# Patient Record
Sex: Female | Born: 2000 | Race: Black or African American | Hispanic: No | Marital: Single | Smoking: Former smoker
Health system: Southern US, Community
[De-identification: ages and names within clinical notes are randomized; demographics above are authoritative.]

## PROBLEM LIST (undated history)

## (undated) ENCOUNTER — Inpatient Hospital Stay (HOSPITAL_COMMUNITY): Payer: Self-pay

## (undated) DIAGNOSIS — O139 Gestational [pregnancy-induced] hypertension without significant proteinuria, unspecified trimester: Secondary | ICD-10-CM

## (undated) DIAGNOSIS — J302 Other seasonal allergic rhinitis: Secondary | ICD-10-CM

## (undated) DIAGNOSIS — J353 Hypertrophy of tonsils with hypertrophy of adenoids: Secondary | ICD-10-CM

## (undated) DIAGNOSIS — F418 Other specified anxiety disorders: Secondary | ICD-10-CM

## (undated) HISTORY — DX: Other specified anxiety disorders: F41.8

## (undated) HISTORY — DX: Gestational (pregnancy-induced) hypertension without significant proteinuria, unspecified trimester: O13.9

## (undated) HISTORY — PX: TONSILLECTOMY: SUR1361

---
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
## 2022-02-10 ENCOUNTER — Ambulatory Visit: Payer: Self-pay

## 2022-11-06 ENCOUNTER — Inpatient Hospital Stay: Admission: RE | Admit: 2022-11-06 | Payer: Self-pay | Source: Ambulatory Visit

## 2022-11-20 ENCOUNTER — Other Ambulatory Visit (HOSPITAL_COMMUNITY)
Admission: RE | Admit: 2022-11-20 | Discharge: 2022-11-20 | Disposition: A | Discharge status: Home / Self Care | Payer: Self-pay | Source: Ambulatory Visit | Attending: Obstetrics & Gynecology | Admitting: Obstetrics & Gynecology

## 2022-11-20 ENCOUNTER — Other Ambulatory Visit (INDEPENDENT_AMBULATORY_CARE_PROVIDER_SITE_OTHER): Payer: Self-pay

## 2022-11-20 DIAGNOSIS — Z113 Encounter for screening for infections with a predominantly sexual mode of transmission: Secondary | ICD-10-CM

## 2022-11-20 NOTE — Progress Notes (Signed)
   NURSE VISIT- STD  SUBJECTIVE:  Krystal [name] is a 22 y.o. G0P0000 GYN patientfemale here for a vaginal swab for STD screen.  She reports the following symptoms: none Denies abnormal vaginal bleeding, significant pelvic pain, fever, or UTI symptoms.  OBJECTIVE:  There were no vitals taken for this visit.  Appears well, in no apparent distress  ASSESSMENT: Vaginal swab for STD screen  PLAN: Self-collected vaginal probe for Gonorrhea, Chlamydia, Trichomonas, Bacterial Vaginosis, Yeast sent to lab Treatment: to be determined once results are received Follow-up as needed if symptoms persist/worsen, or new symptoms develop  Jobe Marker  11/20/2022 4:17 PM

## 2022-11-23 LAB — CERVICOVAGINAL ANCILLARY ONLY
Bacterial Vaginitis (gardnerella): POSITIVE — AB
Candida Glabrata: NEGATIVE
Candida Vaginitis: NEGATIVE
Chlamydia: NEGATIVE
Comment: NEGATIVE
Comment: NEGATIVE
Comment: NEGATIVE
Comment: NEGATIVE
Comment: NEGATIVE
Comment: NORMAL
Neisseria Gonorrhea: NEGATIVE
Trichomonas: NEGATIVE

## 2022-11-26 ENCOUNTER — Other Ambulatory Visit: Payer: Self-pay | Admitting: Adult Health

## 2022-11-26 MED ORDER — METRONIDAZOLE 500 MG PO TABS: 500.0000 mg | ORAL_TABLET | Freq: Two times a day (BID) | ORAL | 0 refills | Status: DC

## 2022-11-26 NOTE — Progress Notes (Signed)
+  BV on vaginal swab will rx flagyl,no sex or alcohol while taking  ?

## 2023-01-10 ENCOUNTER — Ambulatory Visit: Payer: Self-pay | Admitting: Adult Health

## 2023-04-07 ENCOUNTER — Ambulatory Visit: Payer: Self-pay

## 2023-04-12 ENCOUNTER — Ambulatory Visit: Payer: Self-pay

## 2023-05-06 ENCOUNTER — Ambulatory Visit (INDEPENDENT_AMBULATORY_CARE_PROVIDER_SITE_OTHER): Payer: Self-pay | Admitting: *Deleted

## 2023-05-06 ENCOUNTER — Ambulatory Visit: Payer: Self-pay

## 2023-05-06 VITALS — BP 139/90 | HR 108 | Ht 68.0 in | Wt 239.0 lb

## 2023-05-06 DIAGNOSIS — N926 Irregular menstruation, unspecified: Secondary | ICD-10-CM

## 2023-05-06 DIAGNOSIS — Z3201 Encounter for pregnancy test, result positive: Secondary | ICD-10-CM

## 2023-05-06 LAB — POCT URINE PREGNANCY: Preg Test, Ur: POSITIVE — AB

## 2023-05-06 NOTE — Progress Notes (Signed)
   NURSE VISIT- PREGNANCY CONFIRMATION   SUBJECTIVE:  Krystal [name] is a 22 y.o. G66P0000 female at [redacted]w[redacted]d by certain LMP of Patient's last menstrual period was 04/11/2023. Here for pregnancy confirmation.  Home pregnancy test: positive x 5   She reports no complaints.  She is not taking prenatal vitamins.    OBJECTIVE:  BP (!) 139/90 (BP Location: Left Arm, Patient Position: Sitting)   Pulse (!) 108   Ht 5\' 8"  (1.727 m)   Wt 239 lb (108.4 kg)   LMP 04/11/2023   BMI 36.34 kg/m   Appears well, in no apparent distress  Results for orders placed or performed in visit on 05/06/23 (from the past 24 hours)  POCT urine pregnancy   Collection Time: 05/06/23 10:53 AM  Result Value Ref Range   Preg Test, Ur Positive (A) Negative    ASSESSMENT: Positive pregnancy test, [redacted]w[redacted]d by LMP    PLAN: Schedule for dating ultrasound in 4 weeks Prenatal vitamins: plans to begin OTC ASAP   Nausea medicines: not currently needed   OB packet given: Yes  Jobe Marker  05/06/2023 10:55 AM

## 2023-05-23 ENCOUNTER — Telehealth: Payer: Self-pay | Admitting: *Deleted

## 2023-05-23 NOTE — Telephone Encounter (Signed)
 Patient called with c/o very light spotting a few days ago and then again a few days prior.  Denies recent intercourse. States she is not currently bleeding and the spotting was like 2 spots.  Advised to continue to monitor and if bleeding becomes heavy like a period and/or passing clots, to let us  know.  Encouraged to push fluids and pelvic rest. Pt verbalized understanding with no further questions.

## 2023-05-28 ENCOUNTER — Inpatient Hospital Stay (HOSPITAL_COMMUNITY)
Admission: AD | Admit: 2023-05-28 | Discharge: 2023-05-28 | Disposition: A | Discharge status: Home / Self Care | Payer: MEDICAID | Attending: Obstetrics & Gynecology | Admitting: Obstetrics & Gynecology

## 2023-05-28 ENCOUNTER — Encounter (HOSPITAL_COMMUNITY): Payer: Self-pay | Admitting: Obstetrics & Gynecology

## 2023-05-28 ENCOUNTER — Other Ambulatory Visit: Payer: Self-pay | Admitting: Obstetrics & Gynecology

## 2023-05-28 ENCOUNTER — Inpatient Hospital Stay (HOSPITAL_COMMUNITY): Payer: MEDICAID

## 2023-05-28 ENCOUNTER — Telehealth: Payer: Self-pay | Admitting: *Deleted

## 2023-05-28 DIAGNOSIS — Z3491 Encounter for supervision of normal pregnancy, unspecified, first trimester: Secondary | ICD-10-CM

## 2023-05-28 DIAGNOSIS — O23591 Infection of other part of genital tract in pregnancy, first trimester: Secondary | ICD-10-CM | POA: Diagnosis not present

## 2023-05-28 DIAGNOSIS — Z3A01 Less than 8 weeks gestation of pregnancy: Secondary | ICD-10-CM | POA: Diagnosis not present

## 2023-05-28 DIAGNOSIS — O209 Hemorrhage in early pregnancy, unspecified: Secondary | ICD-10-CM

## 2023-05-28 DIAGNOSIS — O3481 Maternal care for other abnormalities of pelvic organs, first trimester: Secondary | ICD-10-CM | POA: Diagnosis not present

## 2023-05-28 DIAGNOSIS — N83291 Other ovarian cyst, right side: Secondary | ICD-10-CM | POA: Diagnosis not present

## 2023-05-28 DIAGNOSIS — B9689 Other specified bacterial agents as the cause of diseases classified elsewhere: Secondary | ICD-10-CM | POA: Insufficient documentation

## 2023-05-28 DIAGNOSIS — N760 Acute vaginitis: Secondary | ICD-10-CM | POA: Insufficient documentation

## 2023-05-28 DIAGNOSIS — O3680X0 Pregnancy with inconclusive fetal viability, not applicable or unspecified: Secondary | ICD-10-CM

## 2023-05-28 LAB — CBC
HCT: 36.8 % (ref 36.0–46.0)
Hemoglobin: 12 g/dL (ref 12.0–15.0)
MCH: 26.6 pg (ref 26.0–34.0)
MCHC: 32.6 g/dL (ref 30.0–36.0)
MCV: 81.6 fL (ref 80.0–100.0)
Platelets: 157 10*3/uL (ref 150–400)
RBC: 4.51 MIL/uL (ref 3.87–5.11)
RDW: 13.3 % (ref 11.5–15.5)
WBC: 5.2 10*3/uL (ref 4.0–10.5)
nRBC: 0 % (ref 0.0–0.2)

## 2023-05-28 LAB — WET PREP, GENITAL
Sperm: NONE SEEN
Trich, Wet Prep: NONE SEEN
WBC, Wet Prep HPF POC: <10 (ref <10)
Yeast Wet Prep HPF POC: NONE SEEN

## 2023-05-28 LAB — URINALYSIS, ROUTINE W REFLEX MICROSCOPIC
Bilirubin Urine: NEGATIVE
Glucose, UA: NEGATIVE mg/dL
Ketones, ur: NEGATIVE mg/dL
Leukocytes,Ua: NEGATIVE
Nitrite: NEGATIVE
Protein, ur: NEGATIVE mg/dL
Specific Gravity, Urine: 1.004 — ABNORMAL LOW (ref 1.005–1.030)
pH: 6 (ref 5.0–8.0)

## 2023-05-28 LAB — HCG, QUANTITATIVE, PREGNANCY: hCG, Beta Chain, Quant, S: 53004 m[IU]/mL — ABNORMAL HIGH (ref <5)

## 2023-05-28 MED ORDER — METRONIDAZOLE 500 MG PO TABS: 500.0000 mg | ORAL_TABLET | Freq: Two times a day (BID) | ORAL | 0 refills | Status: DC

## 2023-05-28 NOTE — MAU Note (Signed)
 Krystal [name] is a 23 y.o. at [redacted]w[redacted]d here in MAU, was at work a few minutes ago.  Noted bright red blood when she went to the bathroom, also a pea sized clot.  Had had spotting, but this was different,seemed bad. None in her underwear when she used restroom here, seemed like a lot when she wiped though.    Onset of complaint: <44min Pain score: none Vitals:   05/28/23 1704  BP: 135/76  Pulse: 83  Resp: 18  Temp: 98.3 F (36.8 C)  SpO2: 100%     Lab orders placed from triage:  vag swabs collected

## 2023-05-28 NOTE — MAU Note (Signed)
 Spoke with lab, they said they had drawn her blood. Pt confirmed.  Now ready for [place]

## 2023-05-28 NOTE — Discharge Instructions (Addendum)
 You were seen in the maternity assessment unit for bleeding when you go to the bathroom.  Workup here showed a living pregnancy within your uterus that is progressing normally.  I recommend that you continue to follow-up with the family tree office for routine prenatal care.     [facility] [employer] for [employer] at [employer] for [facility]             [address] [place], [place] [address] [phone]  Center for [facility] at [facility]                                                             [address], [place], [place], [address] [phone]  Center for [facility] at [facility] [address], [place], [place], [address] [phone]  Center for [facility] at [facility] [address], [place], [place], [address] [phone]  Center for [facility] at [facility]                                 [address] [place], [place], [place], [address] [phone]  Center for [facility] at [facility]                                    [address], [place], [place], [address] [phone]  Center for [facility] at [facility] [address], [place], [place], [address]                              [facility] [address], [address], [place], [place], [address] [phone]  [facility]         Phone: [phone]  [facility]      Phone: [phone]   [facility]      Phone: [phone]  [facility]         Phone: [phone]   [facility]-M[facility]    Phone: [phone]  [facility]      Phone: [phone]  [facility]     Phone: [phone]  [facility]        Phone: [phone]  [facility]) [phone]  [facility]      Phone: [phone]

## 2023-05-28 NOTE — MAU Note (Signed)
 Attempted to call phleb regarding orders,  no answer

## 2023-05-28 NOTE — MAU Note (Signed)
 Received a call from in core lab regarding Wet prep results.... initial report is NOT correct.  She does NOT have trich.  She does have clue cells.  They are putting in a corrected report, but wanted to make sure we were aware.

## 2023-05-28 NOTE — MAU Provider Note (Signed)
 History     CSN: [id number]  Arrival date and time: 05/28/23 1631   Event Date/Time   First Provider Initiated Contact with Patient 05/28/23 1950      Chief Complaint  Patient presents with   Vaginal Bleeding   Vaginal Bleeding Pertinent negatives include no abdominal pain, back pain, chills, diarrhea, dysuria, fever, flank pain, nausea, rash, sore throat or vomiting.   Krystal [name] [id number] presenting to the MAU for bright red bleeding while going to the bathroom. She had currently about [redacted] wks pregnant. She reports passing one pea sized clot. She reports she is not continuing to bleed. She denies cramping.   OB History     Gravida  1   Para  0   Term  0   Preterm  0   AB  0   Living  0      SAB  0   IAB  0   Ectopic  0   Multiple  0   Live Births  0           Past Medical History:  Diagnosis Date   Adenotonsillar hypertrophy 11/19/2011   snores during sleep, stops breathing, and wakes up coughing/choking, per mother   Depression with anxiety    Seasonal allergies     Past Surgical History:  Procedure Laterality Date   CLOSED REDUCTION FOREARM FRACTURE  09/28/2009   left distal radius/ulna   TONSILLECTOMY     TONSILLECTOMY AND ADENOIDECTOMY  11/27/2011   Procedure: TONSILLECTOMY AND ADENOIDECTOMY;  Surgeon: Ana LELON Moccasin, MD;  Location: [facility];  Service: ENT;  Laterality: N/A;   UMBILICAL HERNIA REPAIR  03/14/2007   UPPER GASTROINTESTINAL ENDOSCOPY  12/02/2003   after toxic ingestion    Family History  Problem Relation Age of Onset   Healthy Mother    Sickle cell trait Father    Hypertension Maternal Grandmother    Diabetes Maternal Grandmother    Diabetes Maternal Grandfather     Social History   Tobacco Use   Smoking status: Former    Types: E-cigarettes   Smokeless tobacco: Never   Tobacco comments:    inside smokers at home  Vaping Use   Vaping status: Former   Substances: Nicotine, Flavoring  Substance Use Topics    Alcohol use: No   Drug use: Not Currently    Types: Marijuana    Comment: quit when found out preg    Allergies: No Known Allergies  Medications Prior to Admission  Medication Sig Dispense Refill Last Dose/Taking   Prenatal Vit-Fe Fumarate-FA (MULTIVITAMIN-PRENATAL) 27-0.8 MG TABS tablet Take 1 tablet by mouth daily at 12 noon.   05/28/2023    Review of Systems  Constitutional:  Negative for chills and fever.  HENT:  Negative for congestion and sore throat.   Eyes:  Negative for pain and visual disturbance.  Respiratory:  Negative for cough, chest tightness and shortness of breath.   Cardiovascular:  Negative for chest pain.  Gastrointestinal:  Negative for abdominal pain, diarrhea, nausea and vomiting.  Endocrine: Negative for cold intolerance and heat intolerance.  Genitourinary:  Positive for vaginal bleeding. Negative for dysuria and flank pain.  Musculoskeletal:  Negative for back pain.  Skin:  Negative for rash.  Allergic/Immunologic: Negative for food allergies.  Neurological:  Negative for dizziness and light-headedness.  Psychiatric/Behavioral:  Negative for agitation.    Physical Exam   Blood pressure 135/76, pulse 83, temperature 98.3 F (36.8 C), temperature source Oral, resp. rate 18, height  5' 8 (1.727 m), weight 111.3 kg, last menstrual period 04/11/2023, SpO2 100%.  Physical Exam Vitals and nursing note reviewed.  Constitutional:      General: She is not in acute distress.    Appearance: She is well-developed.  HENT:     Head: Normocephalic and atraumatic.  Eyes:     General: No scleral icterus.    Conjunctiva/sclera: Conjunctivae normal.  Cardiovascular:     Rate and Rhythm: Normal rate.  Pulmonary:     Effort: Pulmonary effort is normal.  Chest:     Chest wall: No tenderness.  Abdominal:     Palpations: Abdomen is soft.     Tenderness: There is no abdominal tenderness. There is no guarding or rebound.  Genitourinary:    Vagina: Normal.   Musculoskeletal:        General: Normal range of motion.     Cervical back: Normal range of motion and neck supple.  Skin:    General: Skin is warm and dry.     Findings: No rash.  Neurological:     Mental Status: She is alert and oriented to person, place, and time.     MAU Course  Procedures  MDM: high  This patient presents to the ED for concern of   Chief Complaint  Patient presents with   Vaginal Bleeding     These complains involves an extensive number of treatment options, and is a complaint that carries with it a high risk of complications and morbidity.  The differential diagnosis for  1.vaginal bleeding in early pregnancy INCLUDES threatened miscarriage, ectopic pregnancy (unless IUP confirmed), normal variant bleeding with live IUP-mostly likely subchorionic hemorrhage in this case. Most likely for this patient is normal given US  results.    Co morbidities that complicate the patient evaluation: None  External records from outside source obtained and reviewed including CareEverywhere and Prenatal care records  Lab Tests: CBC, Wet prep, BHCG, and Gonorrhea/Chlamydia   I ordered, and personally interpreted labs.  The pertinent results include:   Results for orders placed or performed during the hospital encounter of 05/28/23 (from the past 24 hours)  Urinalysis, Routine w reflex microscopic -Urine, Clean Catch     Status: Abnormal   Collection Time: 05/28/23  5:13 PM  Result Value Ref Range   Color, Urine STRAW (A) YELLOW   APPearance CLEAR CLEAR   Specific Gravity, Urine 1.004 (L) 1.005 - 1.030   pH 6.0 5.0 - 8.0   Glucose, UA NEGATIVE NEGATIVE mg/dL   Hgb urine dipstick LARGE (A) NEGATIVE   Bilirubin Urine NEGATIVE NEGATIVE   Ketones, ur NEGATIVE NEGATIVE mg/dL   Protein, ur NEGATIVE NEGATIVE mg/dL   Nitrite NEGATIVE NEGATIVE   Leukocytes,Ua NEGATIVE NEGATIVE   RBC / HPF 0-5 0 - 5 RBC/hpf   WBC, UA 0-5 0 - 5 WBC/hpf   Bacteria, UA RARE (A) NONE SEEN    Squamous Epithelial / HPF 0-5 0 - 5 /HPF  Wet prep, genital     Status: Abnormal   Collection Time: 05/28/23  5:31 PM  Result Value Ref Range   Yeast Wet Prep HPF POC NONE SEEN NONE SEEN   Trich, Wet Prep NONE SEEN NONE SEEN   Clue Cells Wet Prep HPF POC PRESENT (A) NONE SEEN   WBC, Wet Prep HPF POC <10 <10   Sperm NONE SEEN   CBC     Status: None   Collection Time: 05/28/23  6:37 PM  Result Value Ref Range  WBC 5.2 4.0 - 10.5 K/uL   RBC 4.51 3.87 - 5.11 MIL/uL   Hemoglobin 12.0 12.0 - 15.0 g/dL   HCT 63.1 63.9 - 53.9 %   MCV 81.6 80.0 - 100.0 fL   MCH 26.6 26.0 - 34.0 pg   MCHC 32.6 30.0 - 36.0 g/dL   RDW 86.6 88.4 - 84.4 %   Platelets 157 150 - 400 K/uL   nRBC 0.0 0.0 - 0.2 %  ABO/Rh     Status: None   Collection Time: 05/28/23  6:37 PM  Result Value Ref Range   ABO/RH(D)      A POS Performed at [facility], [address], [place], [place] [address]   hCG, quantitative, pregnancy     Status: Abnormal   Collection Time: 05/28/23  6:37 PM  Result Value Ref Range   hCG, Beta Chain, Quant, S 53,004 (H) <5 mIU/mL     Imaging Studies ordered:  I ordered imaging studies includingTransvaginal US  I independently visualized and interpreted imaging which showed Live IUP within [facility] I agree with the radiologist interpretation  US  OB LESS THAN 14 WEEKS WITH OB TRANSVAGINAL Result Date: 05/28/2023 CLINICAL DATA:  Vaginal bleeding. EXAM: OBSTETRIC <14 WK US  AND TRANSVAGINAL OB US  TECHNIQUE: Both transabdominal and transvaginal ultrasound examinations were performed for complete evaluation of the gestation as well as the maternal uterus, adnexal regions, and pelvic cul-de-sac. Transvaginal technique was performed to assess early pregnancy. COMPARISON:  None Available. FINDINGS: Intrauterine gestational sac: Single Yolk sac:  Visualized. Embryo:  Visualized. Cardiac Activity: Visualized. Heart Rate: 131 bpm CRL:  8.5 mm   6 w   6 d                  US  EDC: 01/15/2024 Subchorionic  hemorrhage:  None visualized. Maternal uterus/adnexae: There is a simple cyst in the right ovary measuring 3.6 by 2.2 x 3.0 cm. The left ovary appears within normal limits. There is no pelvic free fluid. IMPRESSION: 1. Single live intrauterine gestation measuring 6 weeks 6 days by crown-rump length. No acute abnormality. 2. Right ovarian simple cyst measuring 3.6 cm. Electronically Signed   By: Greig Pique M.D.   On: 05/28/2023 19:43   Medicines ordered and prescription drug management:  Medications:    Reevaluation of the patient after these medicines showed that the patient improved I have reviewed the patients home medicines and have made adjustments as needed  MAU Course: Reviewed work up and normally progressing pregnancy  After the interventions noted above, I reevaluated the patient and found that they have :improved  Dispostion: discharged   Assessment and Plan   1. Vaginal bleeding affecting early pregnancy   2. Viable pregnancy in first trimester   3. [redacted] weeks gestation of pregnancy   4. BV (bacterial vaginosis)   - Reviewed treatment for BV, discussed using mild soaps - Reviewed results from US  which are reassuring - Continue to monitor sx - Establish care at [facility] and had an appointment   Future Appointments  Date Time Provider Department Center  06/03/2023 11:30 AM [facility] 2 [id number] None    Suzen Octave Idalee Foxworthy 05/28/2023, 8:04 PM

## 2023-05-28 NOTE — Telephone Encounter (Signed)
 Patient called with c/o continued light pink spotting since last week.  States she passed a clot the size of a chia seed earlier today and had some mild cramping associated with it but none since.  Denies recent intercourse. Advised since bleeding has lasted a week, should get HCG to assess.  Lab entered. Will f/u once resulted.  Patient verbalized understanding with no further questions.

## 2023-05-29 LAB — GC/CHLAMYDIA PROBE AMP (~~LOC~~) NOT AT ARMC
Chlamydia: NEGATIVE
Comment: NEGATIVE
Comment: NORMAL
Neisseria Gonorrhea: NEGATIVE

## 2023-05-29 LAB — ABO/RH: ABO/RH(D): A POS

## 2023-06-03 ENCOUNTER — Ambulatory Visit: Payer: MEDICAID | Admitting: Radiology

## 2023-06-03 ENCOUNTER — Other Ambulatory Visit: Payer: Self-pay | Admitting: Adult Health

## 2023-06-03 DIAGNOSIS — Z3A01 Less than 8 weeks gestation of pregnancy: Secondary | ICD-10-CM

## 2023-06-03 DIAGNOSIS — Z3491 Encounter for supervision of normal pregnancy, unspecified, first trimester: Secondary | ICD-10-CM | POA: Diagnosis not present

## 2023-06-03 DIAGNOSIS — O3680X0 Pregnancy with inconclusive fetal viability, not applicable or unspecified: Secondary | ICD-10-CM

## 2023-06-03 MED ORDER — ONDANSETRON HCL 4 MG PO TABS: 4.0000 mg | ORAL_TABLET | Freq: Three times a day (TID) | ORAL | 1 refills | Status: DC | PRN

## 2023-06-03 NOTE — Progress Notes (Signed)
 US :  GA = 7+4 by LMP   check dates  Anteverted uterus with single viable early IUP,  CRL = 14.3 mm correlates to 7+5 weeks ,   FHR = 153 bpm  normal yolk sac = 4 mm  posterior pl, gr0 Normal Left ovary, Normal Rt ov with CL = 33 x 27 mm - neg adnexal regions - neg CDS, no FF

## 2023-06-03 NOTE — Progress Notes (Signed)
Will rx zofran °

## 2023-06-11 ENCOUNTER — Ambulatory Visit
Admission: EM | Admit: 2023-06-11 | Discharge: 2023-06-11 | Disposition: A | Discharge status: Home / Self Care | Payer: MEDICAID | Attending: Family Medicine | Admitting: Family Medicine

## 2023-06-11 DIAGNOSIS — Z20822 Contact with and (suspected) exposure to covid-19: Secondary | ICD-10-CM | POA: Insufficient documentation

## 2023-06-11 DIAGNOSIS — R0981 Nasal congestion: Secondary | ICD-10-CM | POA: Diagnosis not present

## 2023-06-11 MED ORDER — FLUTICASONE PROPIONATE 50 MCG/ACT NA SUSP: 1.0000 | Freq: Two times a day (BID) | NASAL | 2 refills | Status: DC

## 2023-06-11 NOTE — Discharge Instructions (Signed)
I have sent in Flonase to help with your nasal congestion in addition to saline sinus rinses and humidifiers.  We should have your COVID results back tomorrow and we will call if anything comes back abnormal.

## 2023-06-11 NOTE — ED Provider Notes (Signed)
[facility]    CSN: [id number] Arrival date & time: 06/11/23  1107      History   Chief Complaint No chief complaint on file.   HPI Krystal [name] is a 23 y.o. female.   Patient presenting today with 1 week history of nasal congestion, sneezing.  Denies cough, fever, chills, chest pain, shortness of breath, abdominal pain, nausea vomiting or diarrhea.  So far trying nasal saline with minimal relief.  Dad tested positive for COVID yesterday so she is wanting a COVID test.  She does have a history of seasonal allergies not currently on any antihistamines.  Of note, is [redacted] weeks pregnant.    Past Medical History:  Diagnosis Date   Adenotonsillar hypertrophy 11/19/2011   snores during sleep, stops breathing, and wakes up coughing/choking, per mother   Depression with anxiety    Seasonal allergies     Patient Active Problem List   Diagnosis Date Noted   MDD (major depressive disorder) 04/21/2021   Encounter for initial prescription of contraceptive pills 07/14/2020   Menstrual cramps 07/14/2020   Pregnancy examination or test, negative result 07/14/2020   Screening examination for STD (sexually transmitted disease) 07/14/2020   Chlamydia 10/30/2017    Past Surgical History:  Procedure Laterality Date   CLOSED REDUCTION FOREARM FRACTURE  09/28/2009   left distal radius/ulna   TONSILLECTOMY     TONSILLECTOMY AND ADENOIDECTOMY  11/27/2011   Procedure: TONSILLECTOMY AND ADENOIDECTOMY;  Surgeon: Darletta Moll, MD;  Location: [facility];  Service: ENT;  Laterality: N/A;   UMBILICAL HERNIA REPAIR  03/14/2007   UPPER GASTROINTESTINAL ENDOSCOPY  12/02/2003   after toxic ingestion    OB History     Gravida  1   Para  0   Term  0   Preterm  0   AB  0   Living  0      SAB  0   IAB  0   Ectopic  0   Multiple  0   Live Births  0            Home Medications    Prior to Admission medications   Medication Sig Start Date End Date  Taking? Authorizing Provider  fluticasone (FLONASE) 50 MCG/ACT nasal spray Place 1 spray into both nostrils 2 (two) times daily. 06/11/23  Yes Particia Nearing, PA-C  ondansetron (ZOFRAN) 4 MG tablet Take 1 tablet (4 mg total) by mouth every 8 (eight) hours as needed. 06/03/23   Adline Potter, NP  metroNIDAZOLE (FLAGYL) 500 MG tablet Take 1 tablet (500 mg total) by mouth 2 (two) times daily. 05/28/23   Federico Flake, MD  Prenatal Vit-Fe Fumarate-FA (MULTIVITAMIN-PRENATAL) 27-0.8 MG TABS tablet Take 1 tablet by mouth daily at 12 noon.    [provider]    Family History Family History  Problem Relation Age of Onset   Healthy Mother    Sickle cell trait Father    Hypertension Maternal Grandmother    Diabetes Maternal Grandmother    Diabetes Maternal Grandfather     Social History Social History   Tobacco Use   Smoking status: Former    Types: E-cigarettes   Smokeless tobacco: Never   Tobacco comments:    inside smokers at home  Vaping Use   Vaping status: Former   Substances: Nicotine, Flavoring  Substance Use Topics   Alcohol use: No   Drug use: Not Currently    Types: Marijuana  Comment: quit when found out preg     Allergies   Patient has no known allergies.   Review of Systems Review of Systems Per HPI  Physical Exam Triage Vital Signs ED Triage Vitals  Encounter Vitals Group     BP 06/11/23 1215 115/66     Systolic BP Percentile --      Diastolic BP Percentile --      Pulse Rate 06/11/23 1215 83     Resp 06/11/23 1215 16     Temp 06/11/23 1215 98.5 F (36.9 C)     Temp Source 06/11/23 1215 Oral     SpO2 06/11/23 1215 98 %     Weight --      Height --      Head Circumference --      Peak Flow --      Pain Score 06/11/23 1217 0     Pain Loc --      Pain Education --      Exclude from Growth Chart --    No data found.  Updated Vital Signs BP 115/66 (BP Location: Right Arm)   Pulse 83   Temp 98.5 F (36.9 C) (Oral)    Resp 16   LMP 04/11/2023   SpO2 98%   Visual Acuity Right Eye Distance:   Left Eye Distance:   Bilateral Distance:    Right Eye Near:   Left Eye Near:    Bilateral Near:     Physical Exam Vitals and nursing note reviewed.  Constitutional:      Appearance: Normal appearance.  HENT:     Head: Atraumatic.     Right Ear: Tympanic membrane and external ear normal.     Left Ear: Tympanic membrane and external ear normal.     Nose: Rhinorrhea present.     Mouth/Throat:     Mouth: Mucous membranes are moist.     Pharynx: No posterior oropharyngeal erythema.  Eyes:     Extraocular Movements: Extraocular movements intact.     Conjunctiva/sclera: Conjunctivae normal.  Cardiovascular:     Rate and Rhythm: Normal rate and regular rhythm.     Heart sounds: Normal heart sounds.  Pulmonary:     Effort: Pulmonary effort is normal.     Breath sounds: Normal breath sounds. No wheezing or rales.  Musculoskeletal:        General: Normal range of motion.     Cervical back: Normal range of motion and neck supple.  Skin:    General: Skin is warm and dry.  Neurological:     Mental Status: She is alert and oriented to person, place, and time.  Psychiatric:        Mood and Affect: Mood normal.        Thought Content: Thought content normal.      UC Treatments / Results  Labs (all labs ordered are listed, but only abnormal results are displayed) Labs Reviewed  SARS CORONAVIRUS 2 (TAT 6-24 HRS)    EKG   Radiology No results found.  Procedures Procedures (including critical care time)  Medications Ordered in UC Medications - No data to display  Initial Impression / Assessment and Plan / UC Course  I have reviewed the triage vital signs and the nursing notes.  Pertinent labs & imaging results that were available during my care of the patient were reviewed by me and considered in my medical decision making (see chart for details).     COVID testing pending, will treat  with  Flonase, nasal saline, humidifiers, warm honey tea and other supportive measures.  Return for worsening symptoms.  Did recommend daily allergy regimen in case this is seasonal allergies rather than a viral infection. Final Clinical Impressions(s) / UC Diagnoses   Final diagnoses:  Exposure to COVID-19 virus  Nasal congestion     Discharge Instructions      I have sent in Flonase to help with your nasal congestion in addition to saline sinus rinses and humidifiers.  We should have your COVID results back tomorrow and we will call if anything comes back abnormal.    ED Prescriptions     Medication Sig Dispense Auth. Provider   fluticasone (FLONASE) 50 MCG/ACT nasal spray Place 1 spray into both nostrils 2 (two) times daily. 16 g Particia Nearing, [place]      PDMP not reviewed this encounter.   Particia Nearing, [place] 06/11/23 1240

## 2023-06-11 NOTE — ED Triage Notes (Signed)
Pt reports she is here for a covid test.  Has some nasal congestion x 1 week.   Covid exposure

## 2023-06-12 LAB — SARS CORONAVIRUS 2 (TAT 6-24 HRS): SARS Coronavirus 2: NEGATIVE

## 2023-06-21 DIAGNOSIS — Z3400 Encounter for supervision of normal first pregnancy, unspecified trimester: Secondary | ICD-10-CM | POA: Insufficient documentation

## 2023-06-21 DIAGNOSIS — O0990 Supervision of high risk pregnancy, unspecified, unspecified trimester: Secondary | ICD-10-CM | POA: Insufficient documentation

## 2023-06-24 ENCOUNTER — Other Ambulatory Visit: Payer: Self-pay

## 2023-06-24 ENCOUNTER — Ambulatory Visit
Admission: RE | Admit: 2023-06-24 | Discharge: 2023-06-24 | Disposition: A | Discharge status: Home / Self Care | Payer: MEDICAID | Source: Ambulatory Visit

## 2023-06-24 VITALS — BP 122/75 | HR 94 | Temp 98.8°F | Resp 18

## 2023-06-24 DIAGNOSIS — R112 Nausea with vomiting, unspecified: Secondary | ICD-10-CM | POA: Diagnosis not present

## 2023-06-24 DIAGNOSIS — R197 Diarrhea, unspecified: Secondary | ICD-10-CM

## 2023-06-24 NOTE — ED Provider Notes (Signed)
[facility]    CSN: [id number] Arrival date & time: 06/24/23  1148      History   Chief Complaint Chief Complaint  Patient presents with   Nausea    Stomach bug/ note for work - Entered by patient   Emesis   Diarrhea    HPI Krystal [name] is a 23 y.o. female.   Patient presents today with 1 day history of nausea, vomiting, and diarrhea as well as abdominal pain that began yesterday.  She reports today, the abdominal pain, vomiting, and diarrhea is much improved.  She still feels little bit nauseous.  She denies fever, bodyaches or chills.  She thinks she had a stomach bug because her nephew had it as well as her mother.  Reports she has been able to drink Gatorade and keep it down today.  She is requesting a note for work.  Patient also tells me that she is approximately [redacted] weeks pregnant.  She has not had any vomiting with this pregnancy.  She has felt a little bit sick in her stomach at times.  She is following with OB/GYN for prenatal care.  She denies vaginal bleeding.    Past Medical History:  Diagnosis Date   Adenotonsillar hypertrophy 11/19/2011   snores during sleep, stops breathing, and wakes up coughing/choking, per mother   Depression with anxiety    Seasonal allergies     Patient Active Problem List   Diagnosis Date Noted   Supervision of normal first pregnancy 06/21/2023   MDD (major depressive disorder) 04/21/2021   Chlamydia 10/30/2017    Past Surgical History:  Procedure Laterality Date   CLOSED REDUCTION FOREARM FRACTURE  09/28/2009   left distal radius/ulna   TONSILLECTOMY     TONSILLECTOMY AND ADENOIDECTOMY  11/27/2011   Procedure: TONSILLECTOMY AND ADENOIDECTOMY;  Surgeon: Darletta Moll, MD;  Location: [facility];  Service: ENT;  Laterality: N/A;   UMBILICAL HERNIA REPAIR  03/14/2007   UPPER GASTROINTESTINAL ENDOSCOPY  12/02/2003   after toxic ingestion    OB History     Gravida  1   Para  0   Term  0   Preterm   0   AB  0   Living  0      SAB  0   IAB  0   Ectopic  0   Multiple  0   Live Births  0            Home Medications    Prior to Admission medications   Medication Sig Start Date End Date Taking? Authorizing Provider  ondansetron (ZOFRAN) 4 MG tablet Take 1 tablet (4 mg total) by mouth every 8 (eight) hours as needed. 06/03/23   Adline Potter, NP  Prenatal Vit-Fe Fumarate-FA (MULTIVITAMIN-PRENATAL) 27-0.8 MG TABS tablet Take 1 tablet by mouth daily at 12 noon.   Yes [provider]  fluticasone (FLONASE) 50 MCG/ACT nasal spray Place 1 spray into both nostrils 2 (two) times daily. 06/11/23   Particia Nearing, PA-C  metroNIDAZOLE (FLAGYL) 500 MG tablet Take 1 tablet (500 mg total) by mouth 2 (two) times daily. 05/28/23   Federico Flake, MD    Family History Family History  Problem Relation Age of Onset   Healthy Mother    Sickle cell trait Father    Hypertension Maternal Grandmother    Diabetes Maternal Grandmother    Diabetes Maternal Grandfather     Social History Social History   Tobacco  Use   Smoking status: Former    Types: E-cigarettes   Smokeless tobacco: Never   Tobacco comments:    inside smokers at home  Vaping Use   Vaping status: Former   Substances: Nicotine, Flavoring  Substance Use Topics   Alcohol use: No   Drug use: Not Currently    Types: Marijuana    Comment: quit when found out preg     Allergies   Patient has no known allergies.   Review of Systems Review of Systems Per HPI  Physical Exam Triage Vital Signs ED Triage Vitals  Encounter Vitals Group     BP 06/24/23 1232 122/75     Systolic BP Percentile --      Diastolic BP Percentile --      Pulse Rate 06/24/23 1232 94     Resp 06/24/23 1232 18     Temp 06/24/23 1232 98.8 F (37.1 C)     Temp src --      SpO2 06/24/23 1232 98 %     Weight --      Height --      Head Circumference --      Peak Flow --      Pain Score 06/24/23 1229 0      Pain Loc --      Pain Education --      Exclude from Growth Chart --    No data found.  Updated Vital Signs BP 122/75   Pulse 94   Temp 98.8 F (37.1 C)   Resp 18   LMP 04/11/2023   SpO2 98%   Visual Acuity Right Eye Distance:   Left Eye Distance:   Bilateral Distance:    Right Eye Near:   Left Eye Near:    Bilateral Near:     Physical Exam Vitals and nursing note reviewed.  Constitutional:      General: She is not in acute distress.    Appearance: Normal appearance. She is not toxic-appearing.  HENT:     Right Ear: Tympanic membrane, ear canal and external ear normal. There is no impacted cerumen.     Left Ear: Tympanic membrane, ear canal and external ear normal. There is no impacted cerumen.     Mouth/Throat:     Mouth: Mucous membranes are moist.     Pharynx: Oropharynx is clear. No posterior oropharyngeal erythema.  Cardiovascular:     Rate and Rhythm: Normal rate and regular rhythm.  Pulmonary:     Effort: Pulmonary effort is normal. No respiratory distress.     Breath sounds: Normal breath sounds. No wheezing, rhonchi or rales.  Abdominal:     General: Abdomen is flat. Bowel sounds are normal. There is no distension.     Palpations: Abdomen is soft.     Tenderness: There is no abdominal tenderness. There is no right CVA tenderness, left CVA tenderness or guarding.  Musculoskeletal:     Cervical back: Normal range of motion.  Lymphadenopathy:     Cervical: No cervical adenopathy.  Skin:    General: Skin is warm and dry.     Capillary Refill: Capillary refill takes less than 2 seconds.     Coloration: Skin is not jaundiced or pale.     Findings: No erythema.  Neurological:     Mental Status: She is alert and oriented to person, place, and time.  Psychiatric:        Behavior: Behavior is cooperative.      UC Treatments /  Results  Labs (all labs ordered are listed, but only abnormal results are displayed) Labs Reviewed - No data to  display  EKG   Radiology No results found.  Procedures Procedures (including critical care time)  Medications Ordered in UC Medications - No data to display  Initial Impression / Assessment and Plan / UC Course  I have reviewed the triage vital signs and the nursing notes.  Pertinent labs & imaging results that were available during my care of the patient were reviewed by me and considered in my medical decision making (see chart for details).   Patient is well-appearing, normotensive, afebrile, not tachycardic, not tachypneic, oxygenating well on room air.    1. Nausea, vomiting, and diarrhea Symptoms are consistent with viral gastroenteritis; no red flags Symptoms appear to be resolving Vitals are stable today Supportive care discussed Return and ER precautions also discussed Work excuse provided  The patient was given the opportunity to ask questions.  All questions answered to their satisfaction.  The patient is in agreement to this plan.    Final Clinical Impressions(s) / UC Diagnoses   Final diagnoses:  Nausea, vomiting, and diarrhea     Discharge Instructions      As we discussed, it sounds like you had a stomach bug yesterday.  Make sure you are drinking plenty of fluids today.  If you feel like eating, recommend soft, easy to digest foods like rice, mashed bananas, applesauce, dry toast.  Seek care if symptoms worsen despite treatment.    ED Prescriptions   None    PDMP not reviewed this encounter.   Valentino Nose, NP 06/24/23 1357

## 2023-06-24 NOTE — Discharge Instructions (Signed)
As we discussed, it sounds like you had a stomach bug yesterday.  Make sure you are drinking plenty of fluids today.  If you feel like eating, recommend soft, easy to digest foods like rice, mashed bananas, applesauce, dry toast.  Seek care if symptoms worsen despite treatment.

## 2023-06-24 NOTE — ED Triage Notes (Addendum)
PT has had N/V/D since Sunday.  Pt also has been to [facility] clinic for pregnancy . PT reports to be 10 weeks  gestion. PT had ABD pain yesterday but not today.

## 2023-06-26 ENCOUNTER — Other Ambulatory Visit: Payer: MEDICAID

## 2023-06-26 ENCOUNTER — Encounter: Payer: MEDICAID | Admitting: Advanced Practice Midwife

## 2023-06-26 ENCOUNTER — Encounter: Payer: MEDICAID | Admitting: *Deleted

## 2023-06-26 DIAGNOSIS — Z3401 Encounter for supervision of normal first pregnancy, first trimester: Secondary | ICD-10-CM

## 2023-07-03 ENCOUNTER — Other Ambulatory Visit: Payer: Self-pay | Admitting: Obstetrics & Gynecology

## 2023-07-03 DIAGNOSIS — Z3682 Encounter for antenatal screening for nuchal translucency: Secondary | ICD-10-CM

## 2023-07-04 ENCOUNTER — Encounter: Payer: MEDICAID | Admitting: *Deleted

## 2023-07-04 ENCOUNTER — Encounter: Payer: Self-pay | Admitting: Women's Health

## 2023-07-04 ENCOUNTER — Ambulatory Visit (INDEPENDENT_AMBULATORY_CARE_PROVIDER_SITE_OTHER): Payer: MEDICAID

## 2023-07-04 ENCOUNTER — Ambulatory Visit: Payer: MEDICAID | Admitting: Women's Health

## 2023-07-04 VITALS — BP 129/80 | HR 90 | Wt 239.0 lb

## 2023-07-04 DIAGNOSIS — Z3A12 12 weeks gestation of pregnancy: Secondary | ICD-10-CM

## 2023-07-04 DIAGNOSIS — Z1332 Encounter for screening for maternal depression: Secondary | ICD-10-CM

## 2023-07-04 DIAGNOSIS — Z3402 Encounter for supervision of normal first pregnancy, second trimester: Secondary | ICD-10-CM

## 2023-07-04 DIAGNOSIS — Z131 Encounter for screening for diabetes mellitus: Secondary | ICD-10-CM | POA: Diagnosis not present

## 2023-07-04 DIAGNOSIS — Z6836 Body mass index (BMI) 36.0-36.9, adult: Secondary | ICD-10-CM

## 2023-07-04 DIAGNOSIS — Z3682 Encounter for antenatal screening for nuchal translucency: Secondary | ICD-10-CM

## 2023-07-04 DIAGNOSIS — Z3401 Encounter for supervision of normal first pregnancy, first trimester: Secondary | ICD-10-CM

## 2023-07-04 DIAGNOSIS — F418 Other specified anxiety disorders: Secondary | ICD-10-CM | POA: Insufficient documentation

## 2023-07-04 DIAGNOSIS — A749 Chlamydial infection, unspecified: Secondary | ICD-10-CM

## 2023-07-04 MED ORDER — ASPIRIN 81 MG PO TBEC: 81.0000 mg | DELAYED_RELEASE_TABLET | Freq: Every day | ORAL | 3 refills | Status: DC

## 2023-07-04 NOTE — Progress Notes (Signed)
[place] 12 wks,measurements c/w dates,anterior placenta,normal ovaries,FHR 152 bpm,NT 1.3 mm,NB present,CRL 61.29 mm

## 2023-07-04 NOTE — Progress Notes (Signed)
INITIAL OBSTETRICAL VISIT Patient name: Krystal [name] MRN [id number]  Date of birth: 2000-08-12 Chief Complaint:   Initial Prenatal Visit  History of Present Illness:   Krystal [name] is a 23 y.o. G71P0000 African-American female at [redacted]w[redacted]d by LMP c/w u/s at 6 weeks with an Estimated Date of Delivery: 01/16/24 being seen today for her initial obstetrical visit.   Patient's last menstrual period was 04/11/2023. Her obstetrical history is significant for primigravida.   Today she reports nausea-improving, has zofran.  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 Results were: N/A     07/04/2023    2:25 PM  Depression screen PHQ 2/9  Decreased Interest 0  Down, Depressed, Hopeless 0  PHQ - 2 Score 0  Altered sleeping 3  Tired, decreased energy 3  Change in appetite 2  Feeling bad or failure about yourself  0  Trouble concentrating 0  Moving slowly or fidgety/restless 0  Suicidal thoughts 0  PHQ-9 Score 8        07/04/2023    2:25 PM  GAD 7 : Generalized Anxiety Score  Nervous, Anxious, on Edge 3  Control/stop worrying 3  Worry too much - different things 3  Trouble relaxing 3  Restless 1  Easily annoyed or irritable 3  Afraid - awful might happen 3  Total GAD 7 Score 19     Review of Systems:   Pertinent items are noted in HPI Denies cramping/contractions, leakage of fluid, vaginal bleeding, abnormal vaginal discharge w/ itching/odor/irritation, headaches, visual changes, shortness of breath, chest pain, abdominal pain, severe nausea/vomiting, or problems with urination or bowel movements unless otherwise stated above.  Pertinent History Reviewed:  Reviewed past medical,surgical, social, obstetrical and family history.  Reviewed problem list, medications and allergies. OB History  Gravida Para Term Preterm AB Living  1 0 0 0 0 0  SAB IAB Ectopic Multiple Live Births  0 0 0 0 0    # Outcome Date GA Lbr Len/2nd Weight Sex Type  Anes PTL Lv  1 Current            Physical Assessment:   Vitals:   07/04/23 1527  BP: 129/80  Pulse: 90  Weight: 239 lb (108.4 kg)  Body mass index is 36.34 kg/m.       Physical Examination:  General appearance - well appearing, and in no distress  Mental status - alert, oriented to person, place, and time  Psych:  She has a normal mood and affect  Skin - warm and dry, normal color, no suspicious lesions noted  Chest - effort normal, all lung fields clear to auscultation bilaterally  Heart - normal rate and regular rhythm  Abdomen - soft, nontender  Extremities:  No swelling or varicosities noted  Thin prep pap is not done   Chaperone: N/A    TODAY'S NT [place] 12 wks,measurements c/w dates,anterior placenta,normal ovaries,FHR 152 bpm,NT 1.3 mm,NB present,CRL 61.29 mm   No results found for this or any previous visit (from the past 24 hours).  Assessment & Plan:  1) Low-Risk Pregnancy G1P0000 at [redacted]w[redacted]d with an Estimated Date of Delivery: 01/16/24   2) Initial OB visit  3) H/O dep/anx> no meds in 35yrs, doing well, IBH referral entered  4) Nausea> improving, has zofran  Meds:  Meds ordered this encounter  Medications   aspirin EC 81 MG tablet    Sig: Take 1 tablet (81 mg total) by mouth  daily. Swallow whole.    Dispense:  90 tablet    Refill:  3    Initial labs obtained Continue prenatal vitamins Reviewed n/v relief measures and warning s/s to report Reviewed recommended weight gain based on pre-gravid BMI Encouraged well-balanced diet Genetic & carrier screening discussed: requests Panorama, NT/IT, and Horizon  Ultrasound discussed; fetal survey: requested CCNC completed> form faxed if has or is planning to apply for medicaid The nature of [employer] for [employer] with multiple MDs and other Advanced Practice Providers was explained to patient; also emphasized that fellows, residents, and students are part of our team. Does have home bp cuff. Office  bp cuff given: no. Rx sent: n/a. Check bp weekly, let us know if consistently >140/90.   Indications for ASA therapy (per uptodate) OR Two or more of the following: Nulliparity Yes Obesity (BMI>30 kg/m2) Yes Sociodemographic characteristics (African American race, low socioeconomic level) Yes  Follow-up: Return in about 4 weeks (around 08/01/2023) for LROB, 2nd IT, CNM, in person; 8wks from now anatomy u/s and LROB w/ CNM.   Orders Placed This Encounter  Procedures   Urine Culture   Integrated 1   PANORAMA PRENATAL TEST   HORIZON CUSTOM   Hemoglobin A1c   CBC/D/Plt+RPR+Rh+ABO+RubIgG...   Amb ref to [facility]    Cheral Marker CNM, [facility] 07/04/2023 3:46 PM

## 2023-07-04 NOTE — Patient Instructions (Signed)
Krystal [name], thank you for choosing our office today! We appreciate the opportunity to meet your healthcare needs. You may receive a short survey by mail, e-mail, or through [employer]. If you are happy with your care we would appreciate if you could take just a few minutes to complete the survey questions. We read all of your comments and take your feedback very seriously. Thank you again for choosing our office.  [facility] [employer] [facility] Team at [facility]  [facility] ([address] [place], [place] [address]) Entrance C, located off of [address] Free 24/7 valet parking   Nausea & Vomiting Have saltine crackers or pretzels by your bed and eat a few bites before you raise your head out of bed in the morning Eat small frequent meals throughout the day instead of large meals Drink plenty of fluids throughout the day to stay hydrated, just don't drink a lot of fluids with your meals.  This can make your stomach fill up faster making you feel sick Do not brush your teeth right after you eat Products with real ginger are good for nausea, like ginger ale and ginger hard candy Make sure it says made with real ginger! Sucking on sour candy like lemon heads is also good for nausea If your prenatal vitamins make you nauseated, take them at night so you will sleep through the nausea Sea Bands If you feel like you need medicine for the nausea & vomiting please let us know If you are unable to keep any fluids or food down please let us know   Constipation Drink plenty of fluid, preferably water, throughout the day Eat foods high in fiber such as fruits, vegetables, and grains Exercise, such as walking, is a good way to keep your bowels regular Drink warm fluids, especially warm prune juice, or decaf coffee Eat a 1/2 cup of real oatmeal (not instant), 1/2 cup applesauce, and 1/2-1 cup warm prune juice every day If needed, you may take Colace (docusate sodium) stool softener  once or twice a day to help keep the stool soft.  If you still are having problems with constipation, you may take Miralax once daily as needed to help keep your bowels regular.   Home Blood Pressure Monitoring for Patients   Your provider has recommended that you check your blood pressure (BP) at least once a week at home. If you do not have a blood pressure cuff at home, one will be provided for you. Contact your provider if you have not received your monitor within 1 week.   Helpful Tips for Accurate Home Blood Pressure Checks  Don't smoke, exercise, or drink caffeine 30 minutes before checking your BP Use the restroom before checking your BP (a full bladder can raise your pressure) Relax in a comfortable upright chair Feet on the ground Left arm resting comfortably on a flat surface at the level of your heart Legs uncrossed Back supported Sit quietly and don't talk Place the cuff on your bare arm Adjust snuggly, so that only two fingertips can fit between your skin and the top of the cuff Check 2 readings separated by at least one minute Keep a log of your BP readings For a visual, please reference this diagram: http://ccnc.care/bpdiagram  Provider Name: [facility]     Phone: [phone]  Zone 1: ALL CLEAR  Continue to monitor your symptoms:  BP reading is less than 140 (top number) or less than 90 (bottom  number)  No right upper stomach pain No headaches or seeing spots No feeling nauseated or throwing up No swelling in face and hands  Zone 2: CAUTION Call your doctor's office for any of the following:  BP reading is greater than 140 (top number) or greater than 90 (bottom number)  Stomach pain under your ribs in the middle or right side Headaches or seeing spots Feeling nauseated or throwing up Swelling in face and hands  Zone 3: EMERGENCY  Seek immediate medical care if you have any of the following:  BP reading is greater than160 (top number) or greater than  110 (bottom number) Severe headaches not improving with Tylenol Serious difficulty catching your breath Any worsening symptoms from Zone 2    First Trimester of Pregnancy The first trimester of pregnancy is from week 1 until the end of week 12 (months 1 through 3). A week after a sperm fertilizes an egg, the egg will implant on the wall of the uterus. This embryo will begin to develop into a baby. Genes from you and your partner are forming the baby. The female genes determine whether the baby is a boy or a girl. At 6-8 weeks, the eyes and face are formed, and the heartbeat can be seen on ultrasound. At the end of 12 weeks, all the baby's organs are formed.  Now that you are pregnant, you will want to do everything you can to have a healthy baby. Two of the most important things are to get good prenatal care and to follow your health care provider's instructions. Prenatal care is all the medical care you receive before the baby's birth. This care will help prevent, find, and treat any problems during the pregnancy and childbirth. BODY CHANGES Your body goes through many changes during pregnancy. The changes vary from woman to woman.  You may gain or lose a couple of pounds at first. You may feel sick to your stomach (nauseous) and throw up (vomit). If the vomiting is uncontrollable, call your health care provider. You may tire easily. You may develop headaches that can be relieved by medicines approved by your health care provider. You may urinate more often. Painful urination may mean you have a bladder infection. You may develop heartburn as a result of your pregnancy. You may develop constipation because certain hormones are causing the muscles that push waste through your intestines to slow down. You may develop hemorrhoids or swollen, bulging veins (varicose veins). Your breasts may begin to grow larger and become tender. Your nipples may stick out more, and the tissue that surrounds them  (areola) may become darker. Your gums may bleed and may be sensitive to brushing and flossing. Dark spots or blotches (chloasma, mask of pregnancy) may develop on your face. This will likely fade after the baby is born. Your menstrual periods will stop. You may have a loss of appetite. You may develop cravings for certain kinds of food. You may have changes in your emotions from day to day, such as being excited to be pregnant or being concerned that something may go wrong with the pregnancy and baby. You may have more vivid and strange dreams. You may have changes in your hair. These can include thickening of your hair, rapid growth, and changes in texture. Some women also have hair loss during or after pregnancy, or hair that feels dry or thin. Your hair will most likely return to normal after your baby is born. WHAT TO EXPECT AT YOUR PRENATAL  VISITS During a routine prenatal visit: You will be weighed to make sure you and the baby are growing normally. Your blood pressure will be taken. Your abdomen will be measured to track your baby's growth. The fetal heartbeat will be listened to starting around week 10 or 12 of your pregnancy. Test results from any previous visits will be discussed. Your health care provider may ask you: How you are feeling. If you are feeling the baby move. If you have had any abnormal symptoms, such as leaking fluid, bleeding, severe headaches, or abdominal cramping. If you have any questions. Other tests that may be performed during your first trimester include: Blood tests to find your blood type and to check for the presence of any previous infections. They will also be used to check for low iron levels (anemia) and Rh antibodies. Later in the pregnancy, blood tests for diabetes will be done along with other tests if problems develop. Urine tests to check for infections, diabetes, or protein in the urine. An ultrasound to confirm the proper growth and development  of the baby. An amniocentesis to check for possible genetic problems. Fetal screens for spina bifida and Down syndrome. You may need other tests to make sure you and the baby are doing well. HOME CARE INSTRUCTIONS  Medicines Follow your health care provider's instructions regarding medicine use. Specific medicines may be either safe or unsafe to take during pregnancy. Take your prenatal vitamins as directed. If you develop constipation, try taking a stool softener if your health care provider approves. Diet Eat regular, well-balanced meals. Choose a variety of foods, such as meat or vegetable-based protein, fish, milk and low-fat dairy products, vegetables, fruits, and whole grain breads and cereals. Your health care provider will help you determine the amount of weight gain that is right for you. Avoid raw meat and uncooked cheese. These carry germs that can cause birth defects in the baby. Eating four or five small meals rather than three large meals a day may help relieve nausea and vomiting. If you start to feel nauseous, eating a few soda crackers can be helpful. Drinking liquids between meals instead of during meals also seems to help nausea and vomiting. If you develop constipation, eat more high-fiber foods, such as fresh vegetables or fruit and whole grains. Drink enough fluids to keep your urine clear or pale yellow. Activity and Exercise Exercise only as directed by your health care provider. Exercising will help you: Control your weight. Stay in shape. Be prepared for labor and delivery. Experiencing pain or cramping in the lower abdomen or low back is a good sign that you should stop exercising. Check with your health care provider before continuing normal exercises. Try to avoid standing for long periods of time. Move your legs often if you must stand in one place for a long time. Avoid heavy lifting. Wear low-heeled shoes, and practice good posture. You may continue to have sex  unless your health care provider directs you otherwise. Relief of Pain or Discomfort Wear a good support bra for breast tenderness.   Take warm sitz baths to soothe any pain or discomfort caused by hemorrhoids. Use hemorrhoid cream if your health care provider approves.   Rest with your legs elevated if you have leg cramps or low back pain. If you develop varicose veins in your legs, wear support hose. Elevate your feet for 15 minutes, 3-4 times a day. Limit salt in your diet. Prenatal Care Schedule your prenatal visits by the  twelfth week of pregnancy. They are usually scheduled monthly at first, then more often in the last 2 months before delivery. Write down your questions. Take them to your prenatal visits. Keep all your prenatal visits as directed by your health care provider. Safety Wear your seat belt at all times when driving. Make a list of emergency phone numbers, including numbers for family, friends, the hospital, and police and fire departments. General Tips Ask your health care provider for a referral to a local prenatal education class. Begin classes no later than at the beginning of month 6 of your pregnancy. Ask for help if you have counseling or nutritional needs during pregnancy. Your health care provider can offer advice or refer you to specialists for help with various needs. Do not use hot tubs, steam rooms, or saunas. Do not douche or use tampons or scented sanitary pads. Do not cross your legs for long periods of time. Avoid cat litter boxes and soil used by cats. These carry germs that can cause birth defects in the baby and possibly loss of the fetus by miscarriage or stillbirth. Avoid all smoking, herbs, alcohol, and medicines not prescribed by your health care provider. Chemicals in these affect the formation and growth of the baby. Schedule a dentist appointment. At home, brush your teeth with a soft toothbrush and be gentle when you floss. SEEK MEDICAL CARE IF:   You have dizziness. You have mild pelvic cramps, pelvic pressure, or nagging pain in the abdominal area. You have persistent nausea, vomiting, or diarrhea. You have a bad smelling vaginal discharge. You have pain with urination. You notice increased swelling in your face, hands, legs, or ankles. SEEK IMMEDIATE MEDICAL CARE IF:  You have a fever. You are leaking fluid from your vagina. You have spotting or bleeding from your vagina. You have severe abdominal cramping or pain. You have rapid weight gain or loss. You vomit blood or material that looks like coffee grounds. You are exposed to [place] measles and have never had them. You are exposed to fifth disease or chickenpox. You develop a severe headache. You have shortness of breath. You have any kind of trauma, such as from a fall or a car accident. Document Released: 05/01/2001 Document Revised: 09/21/2013 Document Reviewed: 03/17/2013 [facility] Patient Information 2015 [place], [place]. This information is not intended to replace advice given to you by your health care provider. Make sure you discuss any questions you have with your health care provider.

## 2023-07-06 LAB — CBC/D/PLT+RPR+RH+ABO+RUBIGG...
Antibody Screen: NEGATIVE
Basophils Absolute: 0 10*3/uL (ref 0.0–0.2)
Basos: 0 %
EOS (ABSOLUTE): 0.1 10*3/uL (ref 0.0–0.4)
Eos: 1 %
HCV Ab: NONREACTIVE
HIV Screen 4th Generation wRfx: NONREACTIVE
Hematocrit: 36.5 % (ref 34.0–46.6)
Hemoglobin: 12 g/dL (ref 11.1–15.9)
Hepatitis B Surface Ag: NEGATIVE
Immature Grans (Abs): 0 10*3/uL (ref 0.0–0.1)
Immature Granulocytes: 1 %
Lymphocytes Absolute: 1.3 10*3/uL (ref 0.7–3.1)
Lymphs: 23 %
MCH: 26.8 pg (ref 26.6–33.0)
MCHC: 32.9 g/dL (ref 31.5–35.7)
MCV: 82 fL (ref 79–97)
Monocytes Absolute: 0.4 10*3/uL (ref 0.1–0.9)
Monocytes: 8 %
Neutrophils Absolute: 3.9 10*3/uL (ref 1.4–7.0)
Neutrophils: 67 %
Platelets: 176 10*3/uL (ref 150–450)
RBC: 4.48 x10E6/uL (ref 3.77–5.28)
RDW: 12.9 % (ref 11.7–15.4)
RPR Ser Ql: NONREACTIVE
Rh Factor: POSITIVE
Rubella Antibodies, IGG: 6.08 {index} (ref >0.99)
WBC: 5.8 10*3/uL (ref 3.4–10.8)

## 2023-07-06 LAB — INTEGRATED 1
Crown Rump Length: 61.3 mm
Gest. Age on Collection Date: 12.4 wk
Maternal Age at EDD: 21.3 a
Nuchal Translucency (NT): 1.3 mm
Number of Fetuses: 1
PAPP-A Value: 974.5 ng/mL
Sonographer ID#: 309760
Weight: 239 [lb_av]

## 2023-07-06 LAB — URINE CULTURE

## 2023-07-06 LAB — HEMOGLOBIN A1C
Est. average glucose Bld gHb Est-mCnc: 97 mg/dL
Hgb A1c MFr Bld: 5 % (ref 4.8–5.6)

## 2023-07-06 LAB — HCV INTERPRETATION

## 2023-07-11 LAB — PANORAMA PRENATAL TEST FULL PANEL:PANORAMA TEST PLUS 5 ADDITIONAL MICRODELETIONS: FETAL FRACTION: 5.3

## 2023-07-15 ENCOUNTER — Telehealth: Payer: Self-pay | Admitting: Clinical

## 2023-07-15 ENCOUNTER — Encounter: Payer: Self-pay | Admitting: Women's Health

## 2023-07-15 LAB — HORIZON CUSTOM: REPORT SUMMARY: POSITIVE — AB

## 2023-07-15 NOTE — Telephone Encounter (Signed)
Attempt call regarding referral; Left HIPPA-compliant message to call back Mikle Sternberg from [facility] at  [phone] (Ashelyn Mccravy's office).    

## 2023-07-16 ENCOUNTER — Encounter: Payer: Self-pay | Admitting: Women's Health

## 2023-07-16 DIAGNOSIS — D563 Thalassemia minor: Secondary | ICD-10-CM | POA: Insufficient documentation

## 2023-08-01 ENCOUNTER — Ambulatory Visit: Payer: MEDICAID | Admitting: Advanced Practice Midwife

## 2023-08-01 VITALS — BP 133/75 | HR 108 | Wt 242.0 lb

## 2023-08-01 DIAGNOSIS — Z3402 Encounter for supervision of normal first pregnancy, second trimester: Secondary | ICD-10-CM | POA: Diagnosis not present

## 2023-08-01 DIAGNOSIS — Z3A16 16 weeks gestation of pregnancy: Secondary | ICD-10-CM

## 2023-08-01 DIAGNOSIS — D563 Thalassemia minor: Secondary | ICD-10-CM

## 2023-08-01 NOTE — Progress Notes (Signed)
    LOW-RISK PREGNANCY VISIT Patient name: Krystal [name] MRN [id number]  Date of birth: February 09, 2001 Chief Complaint:   Routine Prenatal Visit  History of Present Illness:   Krystal [name] is a 23 y.o. G69P0000 female at [redacted]w[redacted]d with an Estimated Date of Delivery: 01/16/24 being seen today for ongoing management of a low-risk pregnancy.  Today she reports no complaints. Contractions: Not present. Vag. Bleeding: None.  Movement: Present. denies leaking of fluid. Review of Systems:   Pertinent items are noted in HPI Denies abnormal vaginal discharge w/ itching/odor/irritation, headaches, visual changes, shortness of breath, chest pain, abdominal pain, severe nausea/vomiting, or problems with urination or bowel movements unless otherwise stated above. Pertinent History Reviewed:  Reviewed past medical,surgical, social, obstetrical and family history.  Reviewed problem list, medications and allergies. Physical Assessment:   Vitals:   08/01/23 1441  BP: 133/75  Pulse: (!) 108  Weight: 242 lb (109.8 kg)  Body mass index is 36.8 kg/m.        Physical Examination:   General appearance: Well appearing, and in no distress  Mental status: Alert, oriented to person, place, and time  Skin: Warm & dry  Cardiovascular: Normal heart rate noted  Respiratory: Normal respiratory effort, no distress  Abdomen: Soft, gravid, nontender  Pelvic: Cervical exam deferred         Extremities: Edema: None Chaperone:  N/A   Fetal Status: Fetal Heart Rate (bpm): 158   Movement: Present      No results found for this or any previous visit (from the past 24 hours).  Assessment & Plan:    Pregnancy: G1P0000 at [redacted]w[redacted]d 1. [redacted] weeks gestation of pregnancy   2. Encounter for supervision of normal first pregnancy in second trimester (Primary)      Meds: No orders of the defined types were placed in this encounter.  Labs/procedures today: 2nd IT  Plan:  Continue routine obstetrical care  Next visit:  prefers will be in person for [place]     Reviewed: generall obstetric precautions including but not limited to vaginal bleeding, contractions, leaking of fluid and fetal movement were reviewed in detail with the patient.  All questions were answered. Has  home bp cuff.. Check bp weekly, let us know if >140/90.   Follow-up: No follow-ups on file.  Future Appointments  Date Time Provider Department Center  08/29/2023  1:30 PM [facility] [place] [facility] None  08/29/2023  2:30 PM Cheral Marker, CNM [facility]    Orders Placed This Encounter  Procedures   AFP, Serum, Open Spina Bifida   Jacklyn Shell DNP, CNM 08/01/2023 3:13 PM

## 2023-08-01 NOTE — Progress Notes (Signed)
 No c/o today. Plans AFP.

## 2023-08-04 LAB — INTEGRATED 2
AFP MoM: 1.19
Alpha-Fetoprotein: 29.2 ng/mL
Crown Rump Length: 61.3 mm
DIA MoM: 0.81
DIA Value: 98.6 pg/mL
Estriol, Unconjugated: 1.15 ng/mL
Gest. Age on Collection Date: 12.4 wk
Gestational Age: 16.4 wk
Maternal Age at EDD: 21.3 a
Nuchal Translucency (NT): 1.3 mm
Nuchal Translucency MoM: 0.76
Number of Fetuses: 1
PAPP-A MoM: 1.79
PAPP-A Value: 974.5 ng/mL
Sonographer ID#: 309760
Weight: 239 [lb_av]
Weight: 242 [lb_av]
hCG MoM: 1.25
hCG Value: 29.8 [IU]/mL
uE3 MoM: 1.18

## 2023-08-20 ENCOUNTER — Encounter: Payer: Self-pay | Admitting: Women's Health

## 2023-08-28 ENCOUNTER — Other Ambulatory Visit: Payer: Self-pay | Admitting: Obstetrics & Gynecology

## 2023-08-28 DIAGNOSIS — Z363 Encounter for antenatal screening for malformations: Secondary | ICD-10-CM

## 2023-08-29 ENCOUNTER — Encounter: Payer: Self-pay | Admitting: Women's Health

## 2023-08-29 ENCOUNTER — Ambulatory Visit (INDEPENDENT_AMBULATORY_CARE_PROVIDER_SITE_OTHER): Payer: MEDICAID

## 2023-08-29 ENCOUNTER — Ambulatory Visit (INDEPENDENT_AMBULATORY_CARE_PROVIDER_SITE_OTHER): Payer: MEDICAID | Admitting: Women's Health

## 2023-08-29 VITALS — BP 113/82 | HR 96 | Wt 248.0 lb

## 2023-08-29 DIAGNOSIS — Z3A20 20 weeks gestation of pregnancy: Secondary | ICD-10-CM

## 2023-08-29 DIAGNOSIS — Z3402 Encounter for supervision of normal first pregnancy, second trimester: Secondary | ICD-10-CM

## 2023-08-29 DIAGNOSIS — Z363 Encounter for antenatal screening for malformations: Secondary | ICD-10-CM

## 2023-08-29 MED ORDER — CETIRIZINE HCL 10 MG PO TABS: 10.0000 mg | ORAL_TABLET | Freq: Every day | ORAL | 3 refills | Status: DC

## 2023-08-29 NOTE — Progress Notes (Signed)
 LOW-RISK PREGNANCY VISIT Patient name: Krystal [name] MRN [id number]  Date of birth: Aug 24, 2000 Chief Complaint:   Routine Prenatal Visit and Pregnancy Ultrasound  History of Present Illness:   Krystal [name] is a 23 y.o. G64P0000 female at [redacted]w[redacted]d with an Estimated Date of Delivery: 01/16/24 being seen today for ongoing management of a low-risk pregnancy.   Today she reports  bad allergies, has flonase, requests zyrtec rx . Contractions: Not present.  .  Movement: Present. denies leaking of fluid.     07/04/2023    2:25 PM  Depression screen PHQ 2/9  Decreased Interest 0  Down, Depressed, Hopeless 0  PHQ - 2 Score 0  Altered sleeping 3  Tired, decreased energy 3  Change in appetite 2  Feeling bad or failure about yourself  0  Trouble concentrating 0  Moving slowly or fidgety/restless 0  Suicidal thoughts 0  PHQ-9 Score 8        07/04/2023    2:25 PM  GAD 7 : Generalized Anxiety Score  Nervous, Anxious, on Edge 3  Control/stop worrying 3  Worry too much - different things 3  Trouble relaxing 3  Restless 1  Easily annoyed or irritable 3  Afraid - awful might happen 3  Total GAD 7 Score 19      Review of Systems:   Pertinent items are noted in HPI Denies abnormal vaginal discharge w/ itching/odor/irritation, headaches, visual changes, shortness of breath, chest pain, abdominal pain, severe nausea/vomiting, or problems with urination or bowel movements unless otherwise stated above. Pertinent History Reviewed:  Reviewed past medical,surgical, social, obstetrical and family history.  Reviewed problem list, medications and allergies. Physical Assessment:   Vitals:   08/29/23 1435  BP: 113/82  Pulse: 96  Weight: 248 lb (112.5 kg)  Body mass index is 37.71 kg/m.        Physical Examination:   General appearance: Well appearing, and in no distress  Mental status: Alert, oriented to person, place, and time  Skin: Warm & dry  Cardiovascular: Normal heart rate  noted  Respiratory: Normal respiratory effort, no distress  Abdomen: Soft, gravid, nontender  Pelvic: Cervical exam deferred         Extremities: Edema: None  Fetal Status: Fetal Heart Rate (bpm): +u/s   Movement: Present  [place] 20 wks,cephalic,posterior placenta gr 0,normal ovaries,cx 3.4 cm,FHR 148 bpm,SVP of fluid 4.6 cm,EFW 351 g 67%,anatomy complete,no obvious abnormalities   Chaperone: N/A No results found for this or any previous visit (from the past 24 hours).  Assessment & Plan:  1) Low-risk pregnancy G1P0000 at [redacted]w[redacted]d with an Estimated Date of Delivery: 01/16/24   2) Allergies, rx zyrtec   Meds:  Meds ordered this encounter  Medications   cetirizine (ZYRTEC) 10 MG tablet    Sig: Take 1 tablet (10 mg total) by mouth daily.    Dispense:  30 tablet    Refill:  3   Labs/procedures today: U/S  Plan:  Continue routine obstetrical care  Next visit: prefers in person    Reviewed: Preterm labor symptoms and general obstetric precautions including but not limited to vaginal bleeding, contractions, leaking of fluid and fetal movement were reviewed in detail with the patient.  All questions were answered. Does have home bp cuff. Office bp cuff given: not applicable. Check bp weekly, let us know if consistently >140 and/or >90.  Follow-up: Return in about 4 weeks (around 09/26/2023) for LROB w/ pap, CNM, in person.  Future Appointments  Date Time Provider Department Center  09/26/2023  1:50 PM Cheral Marker, CNM [facility]    No orders of the defined types were placed in this encounter.  Cheral Marker CNM, [facility] 08/29/2023 3:09 PM

## 2023-08-29 NOTE — Patient Instructions (Signed)
 Krystal [name], thank you for choosing our office today! We appreciate the opportunity to meet your healthcare needs. You may receive a short survey by mail, e-mail, or through [employer]. If you are happy with your care we would appreciate if you could take just a few minutes to complete the survey questions. We read all of your comments and take your feedback very seriously. Thank you again for choosing our office.  Center for [employer] Team at [facility] at [facility] ([address] [place], [place] [address]) Entrance C, located off of [address] Free 24/7 valet parking  Go to [email or web address] to register for FREE online childbirth classes  Call the office [phone]) or go to [facility] if: You begin to severe cramping Your water breaks.  Sometimes it is a big gush of fluid, sometimes it is just a trickle that keeps getting your panties wet or running down your legs You have vaginal bleeding.  It is normal to have a small amount of spotting if your cervix was checked.   [facility] Pediatricians/Family Doctors [facility]): [address] Colette Ribas, [phone]           [facility]: [address], [phone]                [facility]): [address], [phone] (call to ask if accepting patients) [facility] Department: [address], [place], [phone]    [facility] Pediatricians/Family Doctors [facility]): [phone] [address], [phone] [facility]: [address] [place], [phone] [facility]: [address], [phone]  [facility] Doctors  [facility]): [phone] [facility]: [address], [phone]   [facility] Doctors [facility]: [address], [phone]  [facility] Doctors  [facility]: [phone], [phone]  Home Blood Pressure Monitoring for Patients   Your provider has recommended that you check your blood pressure (BP) at least once a week at home. If you do not have a blood pressure cuff at home, one will be provided for you. Contact your provider if you have not received your monitor within 1 week.   Helpful Tips for Accurate Home Blood Pressure Checks  Don't smoke, exercise, or drink caffeine 30 minutes before checking your BP Use the restroom before checking your BP (a full bladder can raise your pressure) Relax in a comfortable upright chair Feet on the ground Left arm resting comfortably on a flat surface at the level of your heart Legs uncrossed Back supported Sit quietly and don't talk Place the cuff on your bare arm Adjust snuggly, so that only two fingertips can fit between your skin and the top of the cuff Check 2 readings separated by at least one minute Keep a log of your BP readings For a visual, please reference this diagram: http://ccnc.care/bpdiagram  Provider Name: [facility]     Phone: [phone]  Zone 1: ALL CLEAR  Continue to monitor your symptoms:  BP reading is less than 140 (top number) or less than 90 (bottom number)  No right upper stomach pain No headaches or seeing spots No feeling nauseated or throwing up No swelling in face and hands  Zone 2: CAUTION Call your doctor's office for any of the following:  BP reading is greater than 140 (top number) or greater than  90 (bottom number)  Stomach pain under your ribs in the middle or right side Headaches or seeing spots Feeling nauseated or throwing up Swelling in face and hands  Zone 3: EMERGENCY  Seek immediate medical care if you have any of the following:  BP reading is greater than160 (top number) or greater than 110 (bottom number) Severe headaches not improving with Tylenol Serious difficulty catching your breath Any worsening symptoms from  Zone 2     Second Trimester of Pregnancy The second trimester is from week 14 through week 27 (months 4 through 6). The second trimester is often a time when you feel your best. Your body has adjusted to being pregnant, and you begin to feel better physically. Usually, morning sickness has lessened or quit completely, you may have more energy, and you may have an increase in appetite. The second trimester is also a time when the fetus is growing rapidly. At the end of the sixth month, the fetus is about 9 inches long and weighs about 1 pounds. You will likely begin to feel the baby move (quickening) between 16 and 20 weeks of pregnancy. Body changes during your second trimester Your body continues to go through many changes during your second trimester. The changes vary from woman to woman. Your weight will continue to increase. You will notice your lower abdomen bulging out. You may begin to get stretch marks on your hips, abdomen, and breasts. You may develop headaches that can be relieved by medicines. The medicines should be approved by your health care provider. You may urinate more often because the fetus is pressing on your bladder. You may develop or continue to have heartburn as a result of your pregnancy. You may develop constipation because certain hormones are causing the muscles that push waste through your intestines to slow down. You may develop hemorrhoids or swollen, bulging veins (varicose veins). You may have back pain. This is caused by: Weight gain. Pregnancy hormones that are relaxing the joints in your pelvis. A shift in weight and the muscles that support your balance. Your breasts will continue to grow and they will continue to become tender. Your gums may bleed and may be sensitive to brushing and flossing. Dark spots or blotches (chloasma, mask of pregnancy) may develop on your face. This will likely fade after the baby is born. A dark line from your belly button to  the pubic area (linea nigra) may appear. This will likely fade after the baby is born. You may have changes in your hair. These can include thickening of your hair, rapid growth, and changes in texture. Some women also have hair loss during or after pregnancy, or hair that feels dry or thin. Your hair will most likely return to normal after your baby is born.  What to expect at prenatal visits During a routine prenatal visit: You will be weighed to make sure you and the fetus are growing normally. Your blood pressure will be taken. Your abdomen will be measured to track your baby's growth. The fetal heartbeat will be listened to. Any test results from the previous visit will be discussed.  Your health care provider may ask you: How you are feeling. If you are feeling the baby move. If you have had any abnormal symptoms, such as leaking fluid, bleeding, severe headaches, or abdominal cramping. If you are using any tobacco products, including cigarettes, chewing tobacco, and electronic cigarettes. If you have any questions.  Other tests that may be performed during  your second trimester include: Blood tests that check for: Low iron levels (anemia). High blood sugar that affects pregnant women (gestational diabetes) between 71 and 28 weeks. Rh antibodies. This is to check for a protein on red blood cells (Rh factor). Urine tests to check for infections, diabetes, or protein in the urine. An ultrasound to confirm the proper growth and development of the baby. An amniocentesis to check for possible genetic problems. Fetal screens for spina bifida and Down syndrome. HIV (human immunodeficiency virus) testing. Routine prenatal testing includes screening for HIV, unless you choose not to have this test.  Follow these instructions at home: Medicines Follow your health care provider's instructions regarding medicine use. Specific medicines may be either safe or unsafe to take during  pregnancy. Take a prenatal vitamin that contains at least 600 micrograms (mcg) of folic acid. If you develop constipation, try taking a stool softener if your health care provider approves. Eating and drinking Eat a balanced diet that includes fresh fruits and vegetables, whole grains, good sources of protein such as meat, eggs, or tofu, and low-fat dairy. Your health care provider will help you determine the amount of weight gain that is right for you. Avoid raw meat and uncooked cheese. These carry germs that can cause birth defects in the baby. If you have low calcium intake from food, talk to your health care provider about whether you should take a daily calcium supplement. Limit foods that are high in fat and processed sugars, such as fried and sweet foods. To prevent constipation: Drink enough fluid to keep your urine clear or pale yellow. Eat foods that are high in fiber, such as fresh fruits and vegetables, whole grains, and beans. Activity Exercise only as directed by your health care provider. Most women can continue their usual exercise routine during pregnancy. Try to exercise for 30 minutes at least 5 days a week. Stop exercising if you experience uterine contractions. Avoid heavy lifting, wear low heel shoes, and practice good posture. A sexual relationship may be continued unless your health care provider directs you otherwise. Relieving pain and discomfort Wear a good support bra to prevent discomfort from breast tenderness. Take warm sitz baths to soothe any pain or discomfort caused by hemorrhoids. Use hemorrhoid cream if your health care provider approves. Rest with your legs elevated if you have leg cramps or low back pain. If you develop varicose veins, wear support hose. Elevate your feet for 15 minutes, 3-4 times a day. Limit salt in your diet. Prenatal Care Write down your questions. Take them to your prenatal visits. Keep all your prenatal visits as told by your health  care provider. This is important. Safety Wear your seat belt at all times when driving. Make a list of emergency phone numbers, including numbers for family, friends, the hospital, and police and fire departments. General instructions Ask your health care provider for a referral to a local prenatal education class. Begin classes no later than the beginning of month 6 of your pregnancy. Ask for help if you have counseling or nutritional needs during pregnancy. Your health care provider can offer advice or refer you to specialists for help with various needs. Do not use hot tubs, steam rooms, or saunas. Do not douche or use tampons or scented sanitary pads. Do not cross your legs for long periods of time. Avoid cat litter boxes and soil used by cats. These carry germs that can cause birth defects in the baby and possibly loss of the  fetus by miscarriage or stillbirth. Avoid all smoking, herbs, alcohol, and unprescribed drugs. Chemicals in these products can affect the formation and growth of the baby. Do not use any products that contain nicotine or tobacco, such as cigarettes and e-cigarettes. If you need help quitting, ask your health care provider. Visit your dentist if you have not gone yet during your pregnancy. Use a soft toothbrush to brush your teeth and be gentle when you floss. Contact a health care provider if: You have dizziness. You have mild pelvic cramps, pelvic pressure, or nagging pain in the abdominal area. You have persistent nausea, vomiting, or diarrhea. You have a bad smelling vaginal discharge. You have pain when you urinate. Get help right away if: You have a fever. You are leaking fluid from your vagina. You have spotting or bleeding from your vagina. You have severe abdominal cramping or pain. You have rapid weight gain or weight loss. You have shortness of breath with chest pain. You notice sudden or extreme swelling of your face, hands, ankles, feet, or legs. You  have not felt your baby move in over an hour. You have severe headaches that do not go away when you take medicine. You have vision changes. Summary The second trimester is from week 14 through week 27 (months 4 through 6). It is also a time when the fetus is growing rapidly. Your body goes through many changes during pregnancy. The changes vary from woman to woman. Avoid all smoking, herbs, alcohol, and unprescribed drugs. These chemicals affect the formation and growth your baby. Do not use any tobacco products, such as cigarettes, chewing tobacco, and e-cigarettes. If you need help quitting, ask your health care provider. Contact your health care provider if you have any questions. Keep all prenatal visits as told by your health care provider. This is important. This information is not intended to replace advice given to you by your health care provider. Make sure you discuss any questions you have with your health care provider. Document Released: 05/01/2001 Document Revised: 10/13/2015 Document Reviewed: 07/08/2012 Elsevier Interactive Patient Education  2017 [employer].

## 2023-08-29 NOTE — Progress Notes (Signed)
 [place] 20 wks,cephalic,posterior placenta gr 0,normal ovaries,cx 3.4 cm,FHR 148 bpm,SVP of fluid 4.6 cm,EFW 351 g 67%,anatomy complete,no obvious abnormalities

## 2023-08-31 ENCOUNTER — Encounter (HOSPITAL_COMMUNITY): Payer: Self-pay | Admitting: Obstetrics & Gynecology

## 2023-08-31 ENCOUNTER — Inpatient Hospital Stay (HOSPITAL_COMMUNITY)
Admission: AD | Admit: 2023-08-31 | Discharge: 2023-08-31 | Disposition: A | Discharge status: Home / Self Care | Payer: MEDICAID | Attending: Obstetrics & Gynecology | Admitting: Obstetrics & Gynecology

## 2023-08-31 DIAGNOSIS — R1030 Lower abdominal pain, unspecified: Secondary | ICD-10-CM | POA: Diagnosis not present

## 2023-08-31 DIAGNOSIS — O26892 Other specified pregnancy related conditions, second trimester: Secondary | ICD-10-CM | POA: Diagnosis present

## 2023-08-31 DIAGNOSIS — Z3A20 20 weeks gestation of pregnancy: Secondary | ICD-10-CM | POA: Diagnosis not present

## 2023-08-31 DIAGNOSIS — M545 Low back pain, unspecified: Secondary | ICD-10-CM | POA: Diagnosis not present

## 2023-08-31 MED ORDER — CYCLOBENZAPRINE HCL 5 MG PO TABS
10.0000 mg | ORAL_TABLET | Freq: Once | ORAL | Status: AC
  Administered 2023-08-31: 10 mg via ORAL
  Filled 2023-08-31: qty 2

## 2023-08-31 MED ORDER — CYCLOBENZAPRINE HCL 10 MG PO TABS: 10.0000 mg | ORAL_TABLET | Freq: Three times a day (TID) | ORAL | 0 refills | Status: DC | PRN
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
## 2023-09-26 ENCOUNTER — Ambulatory Visit: Payer: MEDICAID | Admitting: Women's Health

## 2023-09-26 ENCOUNTER — Encounter: Payer: Self-pay | Admitting: Women's Health

## 2023-09-26 ENCOUNTER — Other Ambulatory Visit (HOSPITAL_COMMUNITY)
Admission: RE | Admit: 2023-09-26 | Discharge: 2023-09-26 | Disposition: A | Discharge status: Home / Self Care | Payer: MEDICAID | Source: Ambulatory Visit | Attending: Women's Health | Admitting: Women's Health

## 2023-09-26 VITALS — BP 134/81 | HR 86 | Wt 252.0 lb

## 2023-09-26 DIAGNOSIS — Z3402 Encounter for supervision of normal first pregnancy, second trimester: Secondary | ICD-10-CM | POA: Diagnosis not present

## 2023-09-26 DIAGNOSIS — Z3A24 24 weeks gestation of pregnancy: Secondary | ICD-10-CM

## 2023-09-26 DIAGNOSIS — Z124 Encounter for screening for malignant neoplasm of cervix: Secondary | ICD-10-CM | POA: Diagnosis present

## 2023-09-26 NOTE — Patient Instructions (Signed)
 Krystal [name], thank you for choosing our office today! We appreciate the opportunity to meet your healthcare needs. You may receive a short survey by mail, e-mail, or through [employer]. If you are happy with your care we would appreciate if you could take just a few minutes to complete the survey questions. We read all of your comments and take your feedback very seriously. Thank you again for choosing our office.  Center for [employer] Team at [facility]  [facility] at [facility] ([address] [place], [place] [address]) Entrance C, located off of [address] Free 24/7 valet parking   You will have your sugar test next visit.  Please do not eat or drink anything after midnight the night before you come, not even water.  You will be here for at least two hours.  Please make an appointment online for the bloodwork at [email or web address] for 8:00am (or as close to this as possible). Make sure you select the [facility] service center.   CLASSES: Go to [email or web address] to register for classes (childbirth, breastfeeding, waterbirth, infant CPR, daddy bootcamp, etc.)  Call the office [phone]) or go to [facility] if: You begin to have strong, frequent contractions Your water breaks.  Sometimes it is a big gush of fluid, sometimes it is just a trickle that keeps getting your panties wet or running down your legs You have vaginal bleeding.  It is normal to have a small amount of spotting if your cervix was checked.  You don't feel your baby moving like normal.  If you don't, get you something to eat and drink and lay down and focus on feeling your baby move.   If your baby is still not moving like normal, you should call the office or go to [facility].  Call the office [phone]) or go to [facility] hospital for these signs of pre-eclampsia: Severe headache that does not go away with Tylenol  Visual changes- seeing spots, double, blurred vision Pain under your right breast or upper  abdomen that does not go away with Tums or heartburn medicine Nausea and/or vomiting Severe swelling in your hands, feet, and face    [place] Pediatricians/Family Doctors [facility]): [address] Meg Spina, [phone]           [facility]: [address], [phone]                [facility]): [address], [phone]  [facility] Department: [address], [place], [phone]    [facility] Pediatricians/Family Doctors [facility]): [address], [phone] [facility]: [address] [place], [phone] [facility]: [address], [phone]  [facility] Doctors  [facility]): [phone] [facility]: [address] [phone]   [facility] Doctors [facility]: [name], [phone]  [facility] Doctors  [facility]: [phone], [phone]  Home Blood Pressure Monitoring for Patients   Your provider has recommended that you check your blood pressure (BP) at least once a week at home. If you do not have a blood pressure cuff at home, one will be provided for you. Contact your provider if you have not received your monitor within 1 week.   Helpful Tips for Accurate Home Blood Pressure Checks  Don't smoke, exercise, or drink caffeine 30 minutes before checking  your BP Use the restroom before checking your BP (a full bladder can raise your pressure) Relax in a comfortable upright chair Feet on the ground Left arm resting comfortably on a flat surface at the level of your heart Legs uncrossed Back supported Sit quietly and don't talk Place the cuff on your bare arm Adjust snuggly, so that only two fingertips can fit between your skin and the top of the cuff Check 2 readings separated by at least one  minute Keep a log of your BP readings For a visual, please reference this diagram: http://ccnc.care/bpdiagram  Provider Name: [facility]     Phone: [phone]  Zone 1: ALL CLEAR  Continue to monitor your symptoms:  BP reading is less than 140 (top number) or less than 90 (bottom number)  No right upper stomach pain No headaches or seeing spots No feeling nauseated or throwing up No swelling in face and hands  Zone 2: CAUTION Call your doctor's office for any of the following:  BP reading is greater than 140 (top number) or greater than 90 (bottom number)  Stomach pain under your ribs in the middle or right side Headaches or seeing spots Feeling nauseated or throwing up Swelling in face and hands  Zone 3: EMERGENCY  Seek immediate medical care if you have any of the following:  BP reading is greater than160 (top number) or greater than 110 (bottom number) Severe headaches not improving with Tylenol  Serious difficulty catching your breath Any worsening symptoms from Zone 2   Second Trimester of Pregnancy The second trimester is from week 13 through week 28, months 4 through 6. The second trimester is often a time when you feel your best. Your body has also adjusted to being pregnant, and you begin to feel better physically. Usually, morning sickness has lessened or quit completely, you may have more energy, and you may have an increase in appetite. The second trimester is also a time when the fetus is growing rapidly. At the end of the sixth month, the fetus is about 9 inches long and weighs about 1 pounds. You will likely begin to feel the baby move (quickening) between 18 and 20 weeks of the pregnancy. BODY CHANGES Your body goes through many changes during pregnancy. The changes vary from woman to woman.  Your weight will continue to increase. You will notice your lower abdomen bulging out. You may begin to get stretch marks on your hips, abdomen, and breasts. You may  develop headaches that can be relieved by medicines approved by your health care provider. You may urinate more often because the fetus is pressing on your bladder. You may develop or continue to have heartburn as a result of your pregnancy. You may develop constipation because certain hormones are causing the muscles that push waste through your intestines to slow down. You may develop hemorrhoids or swollen, bulging veins (varicose veins). You may have back pain because of the weight gain and pregnancy hormones relaxing your joints between the bones in your pelvis and as a result of a shift in weight and the muscles that support your balance. Your breasts will continue to grow and be tender. Your gums may bleed and may be sensitive to brushing and flossing. Dark spots or blotches (chloasma, mask of pregnancy) may develop on your face. This will likely fade after the baby is born. A dark line from your belly button to the pubic area (linea nigra) may appear. This will likely fade after the  baby is born. You may have changes in your hair. These can include thickening of your hair, rapid growth, and changes in texture. Some women also have hair loss during or after pregnancy, or hair that feels dry or thin. Your hair will most likely return to normal after your baby is born. WHAT TO EXPECT AT YOUR PRENATAL VISITS During a routine prenatal visit: You will be weighed to make sure you and the fetus are growing normally. Your blood pressure will be taken. Your abdomen will be measured to track your baby's growth. The fetal heartbeat will be listened to. Any test results from the previous visit will be discussed. Your health care provider may ask you: How you are feeling. If you are feeling the baby move. If you have had any abnormal symptoms, such as leaking fluid, bleeding, severe headaches, or abdominal cramping. If you have any questions. Other tests that may be performed during your second  trimester include: Blood tests that check for: Low iron levels (anemia). Gestational diabetes (between 24 and 28 weeks). Rh antibodies. Urine tests to check for infections, diabetes, or protein in the urine. An ultrasound to confirm the proper growth and development of the baby. An amniocentesis to check for possible genetic problems. Fetal screens for spina bifida and Down syndrome. HOME CARE INSTRUCTIONS  Avoid all smoking, herbs, alcohol, and unprescribed drugs. These chemicals affect the formation and growth of the baby. Follow your health care provider's instructions regarding medicine use. There are medicines that are either safe or unsafe to take during pregnancy. Exercise only as directed by your health care provider. Experiencing uterine cramps is a good sign to stop exercising. Continue to eat regular, healthy meals. Wear a good support bra for breast tenderness. Do not use hot tubs, steam rooms, or saunas. Wear your seat belt at all times when driving. Avoid raw meat, uncooked cheese, cat litter boxes, and soil used by cats. These carry germs that can cause birth defects in the baby. Take your prenatal vitamins. Try taking a stool softener (if your health care provider approves) if you develop constipation. Eat more high-fiber foods, such as fresh vegetables or fruit and whole grains. Drink plenty of fluids to keep your urine clear or pale yellow. Take warm sitz baths to soothe any pain or discomfort caused by hemorrhoids. Use hemorrhoid cream if your health care provider approves. If you develop varicose veins, wear support hose. Elevate your feet for 15 minutes, 3-4 times a day. Limit salt in your diet. Avoid heavy lifting, wear low heel shoes, and practice good posture. Rest with your legs elevated if you have leg cramps or low back pain. Visit your dentist if you have not gone yet during your pregnancy. Use a soft toothbrush to brush your teeth and be gentle when you floss. A  sexual relationship may be continued unless your health care provider directs you otherwise. Continue to go to all your prenatal visits as directed by your health care provider. SEEK MEDICAL CARE IF:  You have dizziness. You have mild pelvic cramps, pelvic pressure, or nagging pain in the abdominal area. You have persistent nausea, vomiting, or diarrhea. You have a bad smelling vaginal discharge. You have pain with urination. SEEK IMMEDIATE MEDICAL CARE IF:  You have a fever. You are leaking fluid from your vagina. You have spotting or bleeding from your vagina. You have severe abdominal cramping or pain. You have rapid weight gain or loss. You have shortness of breath with chest pain. You  notice sudden or extreme swelling of your face, hands, ankles, feet, or legs. You have not felt your baby move in over an hour. You have severe headaches that do not go away with medicine. You have vision changes. Document Released: 05/01/2001 Document Revised: 05/12/2013 Document Reviewed: 07/08/2012 [facility] Patient Information 2015 [place], [place]. This information is not intended to replace advice given to you by your health care provider. Make sure you discuss any questions you have with your health care provider.

## 2023-09-26 NOTE — Progress Notes (Signed)
    LOW-RISK PREGNANCY VISIT Patient name: Krystal [name] MRN [id number]  Date of birth: 04-20-2001 Chief Complaint:   Routine Prenatal Visit  History of Present Illness:   Krystal [name] is a 23 y.o. G80P0000 female at [redacted]w[redacted]d with an Estimated Date of Delivery: 01/16/24 being seen today for ongoing management of a low-risk pregnancy.   Today she reports no complaints. Contractions: Not present. Vag. Bleeding: None.  Movement: Present. denies leaking of fluid.     07/04/2023    2:25 PM  Depression screen PHQ 2/9  Decreased Interest 0  Down, Depressed, Hopeless 0  PHQ - 2 Score 0  Altered sleeping 3  Tired, decreased energy 3  Change in appetite 2  Feeling bad or failure about yourself  0  Trouble concentrating 0  Moving slowly or fidgety/restless 0  Suicidal thoughts 0  PHQ-9 Score 8        07/04/2023    2:25 PM  GAD 7 : Generalized Anxiety Score  Nervous, Anxious, on Edge 3  Control/stop worrying 3  Worry too much - different things 3  Trouble relaxing 3  Restless 1  Easily annoyed or irritable 3  Afraid - awful might happen 3  Total GAD 7 Score 19      Review of Systems:   Pertinent items are noted in HPI Denies abnormal vaginal discharge w/ itching/odor/irritation, headaches, visual changes, shortness of breath, chest pain, abdominal pain, severe nausea/vomiting, or problems with urination or bowel movements unless otherwise stated above. Pertinent History Reviewed:  Reviewed past medical,surgical, social, obstetrical and family history.  Reviewed problem list, medications and allergies. Physical Assessment:   Vitals:   09/26/23 1355  BP: 134/81  Pulse: 86  Weight: 252 lb (114.3 kg)  Body mass index is 38.32 kg/m.        Physical Examination:   General appearance: Well appearing, and in no distress  Mental status: Alert, oriented to person, place, and time  Skin: Warm & dry  Cardiovascular: Normal heart rate noted  Respiratory: Normal respiratory  effort, no distress  Abdomen: Soft, gravid, nontender  Pelvic: thin prep pap obtained        Extremities:    Fetal Status: Fetal Heart Rate (bpm): 152 Fundal Height: 24 cm Movement: Present    Chaperone: N/A No results found for this or any previous visit (from the past 24 hours).  Assessment & Plan:  1) Low-risk pregnancy G1P0000 at [redacted]w[redacted]d with an Estimated Date of Delivery: 01/16/24   2) Cervical cancer screen, pap today  3) PGBMI 36> EFW 32w   Meds: No orders of the defined types were placed in this encounter.  Labs/procedures today: pap  Plan:  Continue routine obstetrical care  Next visit: prefers will be in person for pn2    Reviewed: Preterm labor symptoms and general obstetric precautions including but not limited to vaginal bleeding, contractions, leaking of fluid and fetal movement were reviewed in detail with the patient.  All questions were answered. Does have home bp cuff. Office bp cuff given: not applicable. Check bp weekly, let us  know if consistently >140 and/or >90.  Follow-up: Return in about 4 weeks (around 10/24/2023) for LROB, PN2, CNM, in person.  No future appointments.  No orders of the defined types were placed in this encounter.  Ferd Householder CNM, [facility] 09/26/2023 2:12 PM

## 2023-09-30 LAB — CYTOLOGY - PAP: Diagnosis: NEGATIVE

## 2023-10-01 ENCOUNTER — Ambulatory Visit: Payer: Self-pay | Admitting: Women's Health

## 2023-10-24 ENCOUNTER — Other Ambulatory Visit: Payer: MEDICAID

## 2023-10-24 ENCOUNTER — Encounter: Payer: Self-pay | Admitting: Obstetrics and Gynecology

## 2023-10-24 ENCOUNTER — Ambulatory Visit: Payer: MEDICAID | Admitting: Obstetrics and Gynecology

## 2023-10-24 VITALS — BP 133/79 | HR 80 | Wt 255.0 lb

## 2023-10-24 DIAGNOSIS — Z6836 Body mass index (BMI) 36.0-36.9, adult: Secondary | ICD-10-CM

## 2023-10-24 DIAGNOSIS — Z3A28 28 weeks gestation of pregnancy: Secondary | ICD-10-CM | POA: Diagnosis not present

## 2023-10-24 DIAGNOSIS — Z3403 Encounter for supervision of normal first pregnancy, third trimester: Secondary | ICD-10-CM

## 2023-10-24 DIAGNOSIS — Z3402 Encounter for supervision of normal first pregnancy, second trimester: Secondary | ICD-10-CM | POA: Diagnosis not present

## 2023-10-24 DIAGNOSIS — Z131 Encounter for screening for diabetes mellitus: Secondary | ICD-10-CM

## 2023-10-24 NOTE — Progress Notes (Signed)
133

## 2023-10-24 NOTE — Progress Notes (Signed)
   PRENATAL VISIT NOTE  Subjective:  Krystal [name] is a 23 y.o. G1P0000 at [redacted]w[redacted]d being seen today for ongoing prenatal care.  She is currently monitored for the following issues for this low-risk pregnancy and has Chlamydia; MDD (major depressive disorder); Supervision of normal first pregnancy; Depression with anxiety; and Alpha thalassemia silent carrier on their problem list.  Patient reports no complaints.  Contractions: Not present.  .  Movement: Present. Denies leaking of fluid.   The following portions of the patient's history were reviewed and updated as appropriate: allergies, current medications, past family history, past medical history, past social history, past surgical history and problem list.   Objective:    Vitals:   10/24/23 0906  BP: 133/79  Pulse: 80  Weight: 255 lb (115.7 kg)    Fetal Status:  Fetal Heart Rate (bpm): 143 Fundal Height: 26 cm Movement: Present    General: Alert, oriented and cooperative. Patient is in no acute distress.  Skin: Skin is warm and dry. No rash noted.   Cardiovascular: Normal heart rate noted  Respiratory: Normal respiratory effort, no problems with respiration noted  Abdomen: Soft, gravid, appropriate for gestational age.  Pain/Pressure: Absent     Pelvic: Cervical exam deferred        Extremities: Normal range of motion.  Edema: None  Mental Status: Normal mood and affect. Normal behavior. Normal judgment and thought content.   Assessment and Plan:  Pregnancy: G1P0000 at [redacted]w[redacted]d 1. Encounter for supervision of normal first pregnancy in second trimester (Primary) BP and FHR normal Doing well, feeling regular movement    2. [redacted] weeks gestation of pregnancy GTT and labs today Wants tdap next visit   3. Adult body mass index 36.0-36.9 Plan EFW 32 week  Preterm labor symptoms and general obstetric precautions including but not limited to vaginal bleeding, contractions, leaking of fluid and fetal movement were reviewed in  detail with the patient. Please refer to After Visit Summary for other counseling recommendations.   Return in about 2 weeks (around 11/07/2023) for OB VISIT (MD or APP).   Susi Eric, FNP

## 2023-10-25 ENCOUNTER — Ambulatory Visit: Payer: Self-pay | Admitting: Obstetrics and Gynecology

## 2023-10-25 MED ORDER — FERROUS SULFATE 325 (65 FE) MG PO TABS: 325.0000 mg | ORAL_TABLET | Freq: Every day | ORAL | 1 refills | Status: DC

## 2023-10-26 ENCOUNTER — Other Ambulatory Visit: Payer: Self-pay | Admitting: Adult Health

## 2023-10-26 LAB — CBC
Hematocrit: 33.9 % — ABNORMAL LOW (ref 34.0–46.6)
Hemoglobin: 10.4 g/dL — ABNORMAL LOW (ref 11.1–15.9)
MCH: 25.7 pg — ABNORMAL LOW (ref 26.6–33.0)
MCHC: 30.7 g/dL — ABNORMAL LOW (ref 31.5–35.7)
MCV: 84 fL (ref 79–97)
Platelets: 155 10*3/uL (ref 150–450)
RBC: 4.04 x10E6/uL (ref 3.77–5.28)
RDW: 13.9 % (ref 11.7–15.4)
WBC: 8.1 10*3/uL (ref 3.4–10.8)

## 2023-10-26 LAB — GLUCOSE TOLERANCE, 2 HOURS W/ 1HR
Glucose, 1 hour: 93 mg/dL (ref 70–179)
Glucose, 2 hour: 103 mg/dL (ref 70–152)
Glucose, Fasting: 85 mg/dL (ref 70–91)

## 2023-10-26 LAB — RPR: RPR Ser Ql: NONREACTIVE

## 2023-10-26 LAB — ANTIBODY SCREEN: Antibody Screen: NEGATIVE

## 2023-10-26 LAB — HIV ANTIBODY (ROUTINE TESTING W REFLEX): HIV Screen 4th Generation wRfx: NONREACTIVE

## 2023-11-05 ENCOUNTER — Encounter: Payer: Self-pay | Admitting: Women's Health

## 2023-11-07 ENCOUNTER — Ambulatory Visit: Payer: MEDICAID | Admitting: Women's Health

## 2023-11-07 ENCOUNTER — Encounter: Payer: Self-pay | Admitting: Women's Health

## 2023-11-07 VITALS — BP 129/85 | HR 103 | Wt 257.6 lb

## 2023-11-07 DIAGNOSIS — Z3A30 30 weeks gestation of pregnancy: Secondary | ICD-10-CM

## 2023-11-07 DIAGNOSIS — Z3403 Encounter for supervision of normal first pregnancy, third trimester: Secondary | ICD-10-CM

## 2023-11-07 DIAGNOSIS — Z23 Encounter for immunization: Secondary | ICD-10-CM | POA: Diagnosis not present

## 2023-11-07 NOTE — Patient Instructions (Addendum)
 Hideko, thank you for choosing our office today! We appreciate the opportunity to meet your healthcare needs. You may receive a short survey by mail, e-mail, or through [employer]. If you are happy with your care we would appreciate if you could take just a few minutes to complete the survey questions. We read all of your comments and take your feedback very seriously. Thank you again for choosing our office.  Center for [employer] Team at [facility]  [facility] at [facility] ([address] [place], [place] [address]) Entrance C, located off of [address] 24/7 valet parking   Constipation Drink plenty of fluid, preferably water, throughout the day Eat foods high in fiber such as fruits, vegetables, and grains Exercise, such as walking, is a good way to keep your bowels regular Drink warm fluids, especially warm prune juice, or decaf coffee Eat a 1/2 cup of real oatmeal (not instant), 1/2 cup applesauce, and 1/2-1 cup warm prune juice every day If needed, you may take Colace (docusate sodium) stool softener once or twice a day to help keep the stool soft. If you are pregnant, wait until you are out of your first trimester (12-14 weeks of pregnancy) If you still are having problems with constipation, you may take Miralax once daily as needed to help keep your bowels regular.  If you are pregnant, wait until you are out of your first trimester (12-14 weeks of pregnancy)    CLASSES: Go to [email or web address] to register for classes (childbirth, breastfeeding, waterbirth, infant CPR, daddy bootcamp, etc.)  Call the office [phone]) or go to [facility] if: You begin to have strong, frequent contractions Your water breaks.  Sometimes it is a big gush of fluid, sometimes it is just a trickle that keeps getting your panties wet or running down your legs You have vaginal bleeding.  It is normal to have a small amount of spotting if your cervix was checked.  You don't  feel your baby moving like normal.  If you don't, get you something to eat and drink and lay down and focus on feeling your baby move.   If your baby is still not moving like normal, you should call the office or go to [facility].  Call the office [phone]) or go to [facility] hospital for these signs of pre-eclampsia: Severe headache that does not go away with Tylenol  Visual changes- seeing spots, double, blurred vision Pain under your right breast or upper abdomen that does not go away with Tums or heartburn medicine Nausea and/or vomiting Severe swelling in your hands, feet, and face   Tdap Vaccine It is recommended that you get the Tdap vaccine during the third trimester of EACH pregnancy to help protect your baby from getting pertussis (whooping cough) 27-36 weeks is the BEST time to do this so that you can pass the protection on to your baby. During pregnancy is better than after pregnancy, but if you are unable to get it during pregnancy it will be offered at the hospital.  You can get this vaccine with us , at the health department, your family doctor, or some local pharmacies Everyone who will be around your baby should also be up-to-date on their vaccines before the baby comes. Adults (who are not pregnant) only need 1 dose of Tdap during adulthood.   [facility] Pediatricians/Family Doctors [facility]): [address] Meg Spina, [phone]           [facility]:  [address], [phone]                [facility]): [address], [phone] (call to ask if accepting patients) [facility] Department: [address], [place], [phone]    [facility] Pediatricians/Family Doctors [facility]): [phone] [address], [phone] [facility]: [address] [place], [phone] [facility]: [address], [phone]  [facility]  Doctors  [facility]): [phone] [facility]: [address] [phone]   [facility] Doctors [facility]: [address] [phone]  [facility] Doctors  [facility]: [phone], [phone]  Home Blood Pressure Monitoring for Patients   Your provider has recommended that you check your blood pressure (BP) at least once a week at home. If you do not have a blood pressure cuff at home, one will be provided for you. Contact your provider if you have not received your monitor within 1 week.   Helpful Tips for Accurate Home Blood Pressure Checks  Don't smoke, exercise, or drink caffeine 30 minutes before checking your BP Use the restroom before checking your BP (a full bladder can raise your pressure) Relax in a comfortable upright chair Feet on the ground Left arm resting comfortably on a flat surface at the level of your heart Legs uncrossed Back supported Sit quietly and don't talk Place the cuff on your bare arm Adjust snuggly, so that only two fingertips can fit between your skin and the top of the cuff Check 2 readings separated by at least one minute Keep a log of your BP readings For a visual, please reference this diagram: http://ccnc.care/bpdiagram  Provider Name: [facility]     Phone: [phone]  Zone 1: ALL CLEAR  Continue to monitor your symptoms:  BP reading is less than 140 (top number) or less than 90 (bottom number)  No right upper stomach pain No headaches or seeing spots No feeling nauseated or throwing up No swelling in face and hands  Zone 2: CAUTION Call your doctor's office for any of the following:  BP reading is greater than 140 (top number) or greater than 90 (bottom number)  Stomach pain under your ribs in the middle or right side Headaches or seeing spots Feeling nauseated or throwing up Swelling in face and hands  Zone 3: EMERGENCY   Seek immediate medical care if you have any of the following:  BP reading is greater than160 (top number) or greater than 110 (bottom number) Severe headaches not improving with Tylenol  Serious difficulty catching your breath Any worsening symptoms from Zone 2  Preterm Labor and Birth Information  The normal length of a pregnancy is 39-41 weeks. Preterm labor is when labor starts before 37 completed weeks of pregnancy. What are the risk factors for preterm labor? Preterm labor is more likely to occur in women who: Have certain infections during pregnancy such as a bladder infection, sexually transmitted infection, or infection inside the uterus (chorioamnionitis). Have a shorter-than-normal cervix. Have gone into preterm labor before. Have had surgery on their cervix. Are younger than age 37 or older than age 81. Are African American. Are pregnant with twins or multiple babies (multiple gestation). Take street drugs or smoke while pregnant. Do not gain enough weight while pregnant. Became pregnant shortly after having been pregnant. What are the symptoms of preterm labor? Symptoms  of preterm labor include: Cramps similar to those that can happen during a menstrual period. The cramps may happen with diarrhea. Pain in the abdomen or lower back. Regular uterine contractions that may feel like tightening of the abdomen. A feeling of increased pressure in the pelvis. Increased watery or bloody mucus discharge from the vagina. Water breaking (ruptured amniotic sac). Why is it important to recognize signs of preterm labor? It is important to recognize signs of preterm labor because babies who are born prematurely may not be fully developed. This can put them at an increased risk for: Long-term (chronic) heart and lung problems. Difficulty immediately after birth with regulating body systems, including blood sugar, body temperature, heart rate, and breathing rate. Bleeding in the  brain. Cerebral palsy. Learning difficulties. Death. These risks are highest for babies who are born before 34 weeks of pregnancy. How is preterm labor treated? Treatment depends on the length of your pregnancy, your condition, and the health of your baby. It may involve: Having a stitch (suture) placed in your cervix to prevent your cervix from opening too early (cerclage). Taking or being given medicines, such as: Hormone medicines. These may be given early in pregnancy to help support the pregnancy. Medicine to stop contractions. Medicines to help mature the baby's lungs. These may be prescribed if the risk of delivery is high. Medicines to prevent your baby from developing cerebral palsy. If the labor happens before 34 weeks of pregnancy, you may need to stay in the hospital. What should I do if I think I am in preterm labor? If you think that you are going into preterm labor, call your health care provider right away. How can I prevent preterm labor in future pregnancies? To increase your chance of having a full-term pregnancy: Do not use any tobacco products, such as cigarettes, chewing tobacco, and e-cigarettes. If you need help quitting, ask your health care provider. Do not use street drugs or medicines that have not been prescribed to you during your pregnancy. Talk with your health care provider before taking any herbal supplements, even if you have been taking them regularly. Make sure you gain a healthy amount of weight during your pregnancy. Watch for infection. If you think that you might have an infection, get it checked right away. Make sure to tell your health care provider if you have gone into preterm labor before. This information is not intended to replace advice given to you by your health care provider. Make sure you discuss any questions you have with your health care provider. Document Revised: 08/29/2018 Document Reviewed: 09/28/2015 Elsevier Patient Education   2020 [employer].

## 2023-11-07 NOTE — Progress Notes (Signed)
 LOW-RISK PREGNANCY VISIT Patient name: ROSALEIGH BRAZZEL MRN [id number]  Date of birth: 08-Nov-2000 Chief Complaint:   Routine Prenatal Visit  History of Present Illness:   DALENA PLANTZ is a 23 y.o. G44P0000 female at [redacted]w[redacted]d with an Estimated Date of Delivery: 01/16/24 being seen today for ongoing management of a low-risk pregnancy.   Today she reports fever blister and constipation. Contractions: Not present. Vag. Bleeding: None.  Movement: Present. denies leaking of fluid.     10/24/2023    9:10 AM 07/04/2023    2:25 PM  Depression screen PHQ 2/9  Decreased Interest 0 0  Down, Depressed, Hopeless 0 0  PHQ - 2 Score 0 0  Altered sleeping 2 3  Tired, decreased energy 2 3  Change in appetite 0 2  Feeling bad or failure about yourself  0 0  Trouble concentrating 0 0  Moving slowly or fidgety/restless 0 0  Suicidal thoughts 0 0  PHQ-9 Score 4 8  Difficult doing work/chores Not difficult at all         07/04/2023    2:25 PM  GAD 7 : Generalized Anxiety Score  Nervous, Anxious, on Edge 3  Control/stop worrying 3  Worry too much - different things 3  Trouble relaxing 3  Restless 1  Easily annoyed or irritable 3  Afraid - awful might happen 3  Total GAD 7 Score 19      Review of Systems:   Pertinent items are noted in HPI Denies abnormal vaginal discharge w/ itching/odor/irritation, headaches, visual changes, shortness of breath, chest pain, abdominal pain, severe nausea/vomiting, or problems with urination or bowel movements unless otherwise stated above. Pertinent History Reviewed:  Reviewed past medical,surgical, social, obstetrical and family history.  Reviewed problem list, medications and allergies. Physical Assessment:   Vitals:   11/07/23 1058  BP: 129/85  Pulse: (!) 103  Weight: 257 lb 9.6 oz (116.8 kg)  Body mass index is 39.17 kg/m.        Physical Examination:   General appearance: Well appearing, and in no distress  Mental status: Alert, oriented to  person, place, and time  Skin: Warm & dry  Cardiovascular: Normal heart rate noted  Respiratory: Normal respiratory effort, no distress  Abdomen: Soft, gravid, nontender  Pelvic: Cervical exam deferred         Extremities: Edema: None  Fetal Status: Fetal Heart Rate (bpm): 135 Fundal Height: 30 cm Movement: Present    Chaperone: N/A No results found for this or any previous visit (from the past 24 hours).  Assessment & Plan:  1) Low-risk pregnancy G1P0000 at [redacted]w[redacted]d with an Estimated Date of Delivery: 01/16/24   2) Fever blister, offered rx, wants to use otc med  3) Constipation> gave printed prevention/relief measures   4) PGBMI 36> EFW 32w  5) Anemia> was 10.4, doesn't remember to take Fe, discussed importance, set alarm, increase foods high in iron   Meds: No orders of the defined types were placed in this encounter.  Labs/procedures today: Tdap  Plan:  Continue routine obstetrical care  Next visit: prefers will be in person for u/s    Reviewed: Preterm labor symptoms and general obstetric precautions including but not limited to vaginal bleeding, contractions, leaking of fluid and fetal movement were reviewed in detail with the patient.  All questions were answered. Does have home bp cuff. Office bp cuff given: not applicable. Check bp weekly, let us  know if consistently >140 and/or >90.  Follow-up: Return in  about 2 weeks (around 11/21/2023) for As scheduled, add EFW to 7/2 visit please.  Future Appointments  Date Time Provider Department Center  11/21/2023 10:50 AM Keene Pastures, DO [facility]  12/05/2023 10:50 AM Majel Scott, CNM [facility]  12/19/2023 10:50 AM Ferd Householder, CNM [facility]    Orders Placed This Encounter  Procedures   Tdap vaccine greater than or equal to 7yo IM   Ferd Householder CNM, [facility] 11/07/2023 11:15 AM

## 2023-11-08 ENCOUNTER — Ambulatory Visit
Admission: EM | Admit: 2023-11-08 | Discharge: 2023-11-08 | Disposition: A | Discharge status: Home / Self Care | Payer: MEDICAID | Attending: Nurse Practitioner | Admitting: Nurse Practitioner

## 2023-11-08 ENCOUNTER — Encounter: Payer: Self-pay | Admitting: Women's Health

## 2023-11-08 DIAGNOSIS — K1370 Unspecified lesions of oral mucosa: Secondary | ICD-10-CM | POA: Diagnosis not present

## 2023-11-08 MED ORDER — CHLORHEXIDINE GLUCONATE 0.12 % MT SOLN: OROMUCOSAL | 0 refills | Status: DC

## 2023-11-08 NOTE — ED Triage Notes (Addendum)
 Patient states that she went to her PCP for bumps on her mouth on the outside. Woke up this morning with lesions on her bottom lip on the inside. No hx of HSV

## 2023-11-08 NOTE — Discharge Instructions (Signed)
 Use medication as prescribed. You may take over-the-counter Tylenol  as needed for pain or discomfort. Avoid foods that are spicy or salty. May rinse mouth with warm water with peroxide while symptoms persist.  Do not swallow the rinse. If symptoms fail to improve with this treatment, you may follow-up in this clinic or with your primary care physician for further evaluation. Follow-up as needed.

## 2023-11-08 NOTE — ED Provider Notes (Signed)
 [facility]    CSN: [id number] Arrival date & time: 11/08/23  1440      History   Chief Complaint Chief Complaint  Patient presents with   Mouth Lesions    HPI Krystal [name] is a 23 y.o. female.   The history is provided by the patient.   Patient presents for complaints of a lesion to the left lower lip that was present this morning upon wakening.  Patient reports she is approximately [redacted] weeks pregnant, and saw her OB yesterday and mentioned an area to the right side of her mouth yesterday.  She states that the OB offered her medication but she declined.  She states she did become concerned when she woke up this morning and saw the new lesion.  The patient denies pain, swelling, oozing, or drainage from the site.  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  States she has been using Campho-Phenique to the affected areas.  Past Medical History:  Diagnosis Date   Adenotonsillar hypertrophy 11/19/2011   snores during sleep, stops breathing, and wakes up coughing/choking, per mother   Depression with anxiety    Seasonal allergies     Patient Active Problem List   Diagnosis Date Noted   Alpha thalassemia silent carrier 07/16/2023   Depression with anxiety 07/04/2023   Supervision of normal first pregnancy 06/21/2023   MDD (major depressive disorder) 04/21/2021   Chlamydia 10/30/2017    Past Surgical History:  Procedure Laterality Date   CLOSED REDUCTION FOREARM FRACTURE  09/28/2009   left distal radius/ulna   TONSILLECTOMY     TONSILLECTOMY AND ADENOIDECTOMY  11/27/2011   Procedure: TONSILLECTOMY AND ADENOIDECTOMY;  Surgeon: Lawence Press, MD;  Location: [facility];  Service: ENT;  Laterality: N/A;   UMBILICAL HERNIA REPAIR  03/14/2007   UPPER GASTROINTESTINAL ENDOSCOPY  12/02/2003   after toxic ingestion    OB History     Gravida  1   Para  0   Term  0   Preterm  0   AB  0   Living  0      SAB  0   IAB   0   Ectopic  0   Multiple  0   Live Births  0            Home Medications    Prior to Admission medications   Medication Sig Start Date End Date Taking? Authorizing Provider  cetirizine  (ZYRTEC ) 10 MG tablet Take 1 tablet (10 mg total) by mouth daily. 08/29/23  Yes Ferd Householder, CNM  chlorhexidine (PERIDEX) 0.12 % solution Gargle and spit 15 mL twice daily for the next 10 days or until symptoms improve. 11/08/23  Yes Leath-Warren, Belen Bowers, NP  acetaminophen  (TYLENOL ) 325 MG tablet Take 650 mg by mouth every 6 (six) hours as needed.    [provider]  aspirin  EC 81 MG tablet Take 1 tablet (81 mg total) by mouth daily. Swallow whole. Patient not taking: Reported on 11/07/2023 07/04/23   Ferd Householder, CNM  cyclobenzaprine  (FLEXERIL ) 10 MG tablet Take 1 tablet (10 mg total) by mouth 3 (three) times daily as needed (headache). 08/31/23   Ebony Goldstein, MD  ferrous sulfate  325 (65 FE) MG tablet Take 1 tablet (325 mg total) by mouth daily with breakfast. Patient not taking: Reported on 11/07/2023 10/25/23   Zelma Hidden, FNP  fluticasone  (FLONASE ) 50 MCG/ACT nasal spray Place 1  spray into both nostrils 2 (two) times daily. Patient not taking: Reported on 11/07/2023 06/11/23   Corbin Dess, PA-C  ondansetron  (ZOFRAN ) 4 MG tablet Take 1 tablet (4 mg total) by mouth every 8 (eight) hours as needed. Patient not taking: Reported on 11/07/2023 10/29/23   Javan Messing, NP  Prenatal Vit-Fe Fumarate-FA (MULTIVITAMIN-PRENATAL) 27-0.8 MG TABS tablet Take 1 tablet by mouth daily at 12 noon. Patient not taking: Reported on 11/07/2023    [provider]    Family History Family History  Problem Relation Age of Onset   Healthy Mother    Sickle cell trait Father    Hypertension Maternal Grandmother    Diabetes Maternal Grandmother    Diabetes Maternal Grandfather     Social History Social History   Tobacco Use   Smoking status: Former    Types:  E-cigarettes   Smokeless tobacco: Never   Tobacco comments:    inside smokers at home  Vaping Use   Vaping status: Every Day   Substances: Nicotine, Flavoring  Substance Use Topics   Alcohol use: No   Drug use: Not Currently    Types: Marijuana    Comment: quit when found out preg     Allergies   Patient has no known allergies.   Review of Systems Review of Systems Per HPI  Physical Exam Triage Vital Signs ED Triage Vitals [11/08/23 1452]  Encounter Vitals Group     BP 132/76     Girls Systolic BP Percentile      Girls Diastolic BP Percentile      Boys Systolic BP Percentile      Boys Diastolic BP Percentile      Pulse Rate (!) 117     Resp 17     Temp 98.3 F (36.8 C)     Temp Source Oral     SpO2 97 %     Weight      Height      Head Circumference      Peak Flow      Pain Score 0     Pain Loc      Pain Education      Exclude from Growth Chart    No data found.  Updated Vital Signs BP 132/76 (BP Location: Right Arm)   Pulse (!) 117   Temp 98.3 F (36.8 C) (Oral)   Resp 17   LMP 04/11/2023   SpO2 97%   Visual Acuity Right Eye Distance:   Left Eye Distance:   Bilateral Distance:    Right Eye Near:   Left Eye Near:    Bilateral Near:     Physical Exam Vitals and nursing note reviewed.  Constitutional:      General: She is not in acute distress.    Appearance: Normal appearance.  HENT:     Head: Normocephalic.     Mouth/Throat:     Lips: Pink. No lesions.     Mouth: Oral lesions (left lower lip) present.     Tongue: No lesions.     Pharynx: Oropharynx is clear. Uvula midline.   Eyes:     Extraocular Movements: Extraocular movements intact.     Pupils: Pupils are equal, round, and reactive to light.   Pulmonary:     Effort: Pulmonary effort is normal.   Musculoskeletal:     Cervical back: Normal range of motion.   Skin:    General: Skin is warm and dry.   Neurological:  General: No focal deficit present.     Mental Status:  She is alert and oriented to person, place, and time.   Psychiatric:        Mood and Affect: Mood normal.        Behavior: Behavior normal.      UC Treatments / Results  Labs (all labs ordered are listed, but only abnormal results are displayed) Labs Reviewed - No data to display  EKG   Radiology No results found.  Procedures Procedures (including critical care time)  Medications Ordered in UC Medications - No data to display  Initial Impression / Assessment and Plan / UC Course  I have reviewed the triage vital signs and the nursing notes.  Pertinent labs & imaging results that were available during my care of the patient were reviewed by me and considered in my medical decision making (see chart for details).  Will treat oral lesion with Peridex 0.12% mouthwash for patient to gargle and spit.  Suspicion for stomatitis.  Supportive care recommendations were provided and discussed with the patient to include warm gargles with peroxide dilution, avoiding spicy or salty foods, and over-the-counter Tylenol  for pain or discomfort.  Discussed indications with patient regarding follow-up.  Patient was in agreement with this plan of care and verbalizes understanding.  All questions were answered.  Patient stable for discharge. Final Clinical Impressions(s) / UC Diagnoses   Final diagnoses:  Oral lesion     Discharge Instructions      Use medication as prescribed. You may take over-the-counter Tylenol  as needed for pain or discomfort. Avoid foods that are spicy or salty. May rinse mouth with warm water with peroxide while symptoms persist.  Do not swallow the rinse. If symptoms fail to improve with this treatment, you may follow-up in this clinic or with your primary care physician for further evaluation. Follow-up as needed.     ED Prescriptions     Medication Sig Dispense Auth. Provider   chlorhexidine (PERIDEX) 0.12 % solution Gargle and spit 15 mL twice daily for  the next 10 days or until symptoms improve. 200 mL Leath-Warren, Belen Bowers, NP      PDMP not reviewed this encounter.   Hardy Lia, NP 11/08/23 1614

## 2023-11-13 ENCOUNTER — Ambulatory Visit: Payer: Self-pay

## 2023-11-21 ENCOUNTER — Ambulatory Visit: Payer: MEDICAID | Admitting: Obstetrics & Gynecology

## 2023-11-21 ENCOUNTER — Ambulatory Visit: Payer: MEDICAID

## 2023-11-21 ENCOUNTER — Encounter: Payer: Self-pay | Admitting: Obstetrics & Gynecology

## 2023-11-21 VITALS — BP 123/75 | HR 121 | Wt 257.0 lb

## 2023-11-21 DIAGNOSIS — E669 Obesity, unspecified: Secondary | ICD-10-CM

## 2023-11-21 DIAGNOSIS — Z3483 Encounter for supervision of other normal pregnancy, third trimester: Secondary | ICD-10-CM

## 2023-11-21 DIAGNOSIS — O99213 Obesity complicating pregnancy, third trimester: Secondary | ICD-10-CM | POA: Diagnosis not present

## 2023-11-21 DIAGNOSIS — Z3402 Encounter for supervision of normal first pregnancy, second trimester: Secondary | ICD-10-CM

## 2023-11-21 DIAGNOSIS — Z3A32 32 weeks gestation of pregnancy: Secondary | ICD-10-CM

## 2023-11-21 DIAGNOSIS — O09213 Supervision of pregnancy with history of pre-term labor, third trimester: Secondary | ICD-10-CM | POA: Diagnosis not present

## 2023-11-21 NOTE — Progress Notes (Signed)
 US  32 wks,cephalic,FHR 157 bpm,posterior placenta gr 0,AFI 16 cm,normal ovaries,EFW 1987 g 55%

## 2023-11-21 NOTE — Progress Notes (Signed)
   LOW-RISK PREGNANCY VISIT Patient name: Krystal [name] MRN [id number]  Date of birth: 01-06-2001 Chief Complaint:   Routine Prenatal Visit  History of Present Illness:   Krystal [name] is a 23 y.o. G91P0000 female at [redacted]w[redacted]d with an Estimated Date of Delivery: 01/16/24 being seen today for ongoing management of a low-risk pregnancy.  -obesity     10/24/2023    9:10 AM 07/04/2023    2:25 PM  Depression screen PHQ 2/9  Decreased Interest 0 0  Down, Depressed, Hopeless 0 0  PHQ - 2 Score 0 0  Altered sleeping 2 3  Tired, decreased energy 2 3  Change in appetite 0 2  Feeling bad or failure about yourself  0 0  Trouble concentrating 0 0  Moving slowly or fidgety/restless 0 0  Suicidal thoughts 0 0  PHQ-9 Score 4 8  Difficult doing work/chores Not difficult at all     Today she reports no complaints. Contractions: Not present. Vag. Bleeding: None.  Movement: Present. denies leaking of fluid. Review of Systems:   Pertinent items are noted in HPI Denies abnormal vaginal discharge w/ itching/odor/irritation, headaches, visual changes, shortness of breath, chest pain, abdominal pain, severe nausea/vomiting, or problems with urination or bowel movements unless otherwise stated above. Pertinent History Reviewed:  Reviewed past medical,surgical, social, obstetrical and family history.  Reviewed problem list, medications and allergies.  Physical Assessment:   Vitals:   11/21/23 1103  BP: 123/75  Pulse: (!) 121  Weight: 257 lb (116.6 kg)  Body mass index is 39.08 kg/m.        Physical Examination:   General appearance: Well appearing, and in no distress  Mental status: Alert, oriented to person, place, and time  Skin: Warm & dry  Respiratory: Normal respiratory effort, no distress  Abdomen: Soft, gravid, nontender  Pelvic: Cervical exam deferred         Extremities: Edema: None  Psych:  mood and affect appropriate  Fetal Status: Fetal Heart Rate (bpm): 140 Fundal Height: 34 cm  Movement: Present    Chaperone: n/a    No results found for this or any previous visit (from the past 24 hours).   Assessment & Plan:  1) Low-risk pregnancy G1P0000 at [redacted]w[redacted]d with an Estimated Date of Delivery: 01/16/24   -obesity plan for US  in 3rd trimester   Meds: No orders of the defined types were placed in this encounter.  Labs/procedures today: none  Plan:  Continue routine obstetrical care  Next visit: prefers in person    Reviewed: Preterm labor symptoms and general obstetric precautions including but not limited to vaginal bleeding, contractions, leaking of fluid and fetal movement were reviewed in detail with the patient.  All questions were answered. Pt has home bp cuff. Check bp weekly, let us  know if >140/90.   Follow-up: Return in about 2 weeks (around 12/05/2023) for LROB visit and growth next available.  Orders Placed This Encounter  Procedures   US  OB Follow Up    Makyla Bye, DO Attending Obstetrician & Gynecologist, [facility] for [employer], [facility]

## 2023-11-26 ENCOUNTER — Inpatient Hospital Stay (HOSPITAL_COMMUNITY)
Admission: AD | Admit: 2023-11-26 | Discharge: 2023-11-26 | Disposition: A | Discharge status: Home / Self Care | Payer: MEDICAID | Attending: Obstetrics and Gynecology | Admitting: Obstetrics and Gynecology

## 2023-11-26 ENCOUNTER — Encounter (HOSPITAL_COMMUNITY): Payer: Self-pay | Admitting: Obstetrics and Gynecology

## 2023-11-26 DIAGNOSIS — O212 Late vomiting of pregnancy: Secondary | ICD-10-CM | POA: Insufficient documentation

## 2023-11-26 DIAGNOSIS — O26893 Other specified pregnancy related conditions, third trimester: Secondary | ICD-10-CM | POA: Diagnosis not present

## 2023-11-26 DIAGNOSIS — Z3A32 32 weeks gestation of pregnancy: Secondary | ICD-10-CM | POA: Diagnosis not present

## 2023-11-26 DIAGNOSIS — R12 Heartburn: Secondary | ICD-10-CM | POA: Diagnosis not present

## 2023-11-26 DIAGNOSIS — R42 Dizziness and giddiness: Secondary | ICD-10-CM | POA: Insufficient documentation

## 2023-11-26 DIAGNOSIS — O219 Vomiting of pregnancy, unspecified: Secondary | ICD-10-CM

## 2023-11-26 LAB — WET PREP, GENITAL
Clue Cells Wet Prep HPF POC: NONE SEEN
Sperm: NONE SEEN
Trich, Wet Prep: NONE SEEN
WBC, Wet Prep HPF POC: <10 (ref <10)
Yeast Wet Prep HPF POC: NONE SEEN

## 2023-11-26 LAB — COMPREHENSIVE METABOLIC PANEL WITH GFR
ALT: 12 U/L (ref 0–44)
AST: 12 U/L — ABNORMAL LOW (ref 15–41)
Albumin: 2.9 g/dL — ABNORMAL LOW (ref 3.5–5.0)
Alkaline Phosphatase: 72 U/L (ref 38–126)
Anion gap: 10 (ref 5–15)
BUN: <5 mg/dL — ABNORMAL LOW (ref 6–20)
CO2: 22 mmol/L (ref 22–32)
Calcium: 8.9 mg/dL (ref 8.9–10.3)
Chloride: 104 mmol/L (ref 98–111)
Creatinine, Ser: 0.49 mg/dL (ref 0.44–1.00)
GFR, Estimated: >60 mL/min (ref >60)
Glucose, Bld: 89 mg/dL (ref 70–99)
Potassium: 3.8 mmol/L (ref 3.5–5.1)
Sodium: 136 mmol/L (ref 135–145)
Total Bilirubin: 0.5 mg/dL (ref 0.0–1.2)
Total Protein: 6.4 g/dL — ABNORMAL LOW (ref 6.5–8.1)

## 2023-11-26 LAB — URINALYSIS, ROUTINE W REFLEX MICROSCOPIC
Bilirubin Urine: NEGATIVE
Glucose, UA: NEGATIVE mg/dL
Hgb urine dipstick: NEGATIVE
Ketones, ur: NEGATIVE mg/dL
Leukocytes,Ua: NEGATIVE
Nitrite: NEGATIVE
Protein, ur: NEGATIVE mg/dL
Specific Gravity, Urine: 1.009 (ref 1.005–1.030)
pH: 6 (ref 5.0–8.0)

## 2023-11-26 LAB — CBC
HCT: 32.5 % — ABNORMAL LOW (ref 36.0–46.0)
Hemoglobin: 10.6 g/dL — ABNORMAL LOW (ref 12.0–15.0)
MCH: 25.4 pg — ABNORMAL LOW (ref 26.0–34.0)
MCHC: 32.6 g/dL (ref 30.0–36.0)
MCV: 77.9 fL — ABNORMAL LOW (ref 80.0–100.0)
Platelets: 147 K/uL — ABNORMAL LOW (ref 150–400)
RBC: 4.17 MIL/uL (ref 3.87–5.11)
RDW: 14.3 % (ref 11.5–15.5)
WBC: 8.4 K/uL (ref 4.0–10.5)
nRBC: 0 % (ref 0.0–0.2)

## 2023-11-26 LAB — AMYLASE: Amylase: 56 U/L (ref 28–100)

## 2023-11-26 LAB — LIPASE, BLOOD: Lipase: 29 U/L (ref 11–51)

## 2023-11-26 MED ORDER — METOCLOPRAMIDE HCL 10 MG PO TABS
10.0000 mg | ORAL_TABLET | Freq: Once | ORAL | Status: AC
  Administered 2023-11-26: 10 mg via ORAL
  Filled 2023-11-26: qty 1

## 2023-11-26 MED ORDER — FAMOTIDINE 20 MG PO TABS
20.0000 mg | ORAL_TABLET | Freq: Once | ORAL | Status: AC
  Administered 2023-11-26: 20 mg via ORAL
  Filled 2023-11-26: qty 1

## 2023-11-26 MED ORDER — METOCLOPRAMIDE HCL 10 MG PO TABS: 10.0000 mg | ORAL_TABLET | Freq: Four times a day (QID) | ORAL | 0 refills | Status: DC | PRN

## 2023-11-26 MED ORDER — FAMOTIDINE 20 MG PO TABS: 20.0000 mg | ORAL_TABLET | Freq: Every day | ORAL | 2 refills | Status: DC

## 2023-11-26 NOTE — Discharge Instructions (Addendum)
 Krystal [name]: Krystal [name] contractions can happen for many weeks before real labor begins. These "practice" contractions can be very painful and can make you think you are in labor when you are not. You might notice them more at the end of the day. Usually, Krystal [name] contractions are less regular and not as strong as "true" labor. Time your contractions and note whether they continue when you are resting and drinking water after you empty your bladder. If rest and hydration make the contractions go away, they are not true labor contractions.  Below is a summary of some differences between true labor and false labor. But sometimes the only way to tell the difference is by having a vaginal exam to find changes in your cervix that signal the start of labor.  Timing and frequency of contractions: True labor contractions come at regular intervals. They have a pattern. As time goes on, they get closer together. Each lasts about 60 or 90 seconds. Krystal [name] contractions do not have a pattern and they do not get closer together. These are called Krystal [name] contractions.  Change with movement: True labor contractions continue even when you rest or move around. Krystal [name] contractions may stop when you walk or rest. They also may stop with a change of position.  Strength of contractions: True labor contractions steadily get stronger. Krystal [name] contractions are weak and do not get much stronger. They may start strong and then weaken.  Location of pain: Pain from true labor contractions usually starts in the back and moves to the front. Pain from [facility] contractions usually is felt only in the front.  What to try to tell the difference: Empty your bladder. Drink 16 ounces of fluid. Rest if you were active when contractions started OR walk around if you were resting when contractions started. Start timing your contractions. Note how long each contraction lasts, and how far  apart different contractions are. If contractions are getting stronger and closer together with time, they are more likely labor contractions.  Reasons to return to MAU at [facility]: Less than 36 weeks: Contractions feels like menstrual cramps. You should go to the hospital if you have more than 6 contractions in an hour, even after you have rested and drank at least 16 ounces of water.  More than 36 weeks: You begin to have strong, frequent contractions 5 minutes apart or less, each last 1 minute, these have been going on for 1-2 hours, and you cannot walk or talk during them. Your water breaks.  Sometimes it is a big gush of fluid. However, many times it may it may be much more subtle. You should go to the hospital if you have a constant leakage of fluid from your vagina, enough to soak a pad when you are walking around.  You have vaginal bleeding.  It is normal to have a small amount of spotting if your cervix was checked. If you have bleeding requiring the use of a pad, go to the hospital. You don't feel your baby moving like normal.  If you think that you baby's movement is decreased, eat a snack and rest on your left side in a quiet room for one hour. If you have not felt the baby move more than 6 times in an hour GO TO THE HOSPITAL.

## 2023-11-26 NOTE — MAU Provider Note (Signed)
 Chief Complaint:  No chief complaint on file.   HPI   None     Krystal [name] is a 23 y.o. G1P0000 at [redacted]w[redacted]d who presents to maternity admissions reporting nausea and lightheadedness earlier today.  She was at work where it is very hot and was nauseous and lightheaded.  She drank some water and had some ginger ale which offered minimal relief.  She also notes ongoing heartburn that has not been well-controlled with as needed Tums.  She has felt a cramping sensation diffusely across her abdomen every once in a while.  Pregnancy Course: Receives care at [facility]. Prenatal records reviewed.   Past Medical History:  Diagnosis Date   Adenotonsillar hypertrophy 11/19/2011   snores during sleep, stops breathing, and wakes up coughing/choking, per mother   Depression with anxiety    Seasonal allergies    OB History  Gravida Para Term Preterm AB Living  1 0 0 0 0 0  SAB IAB Ectopic Multiple Live Births  0 0 0 0 0    # Outcome Date GA Lbr Len/2nd Weight Sex Type Anes PTL Lv  1 Current            Past Surgical History:  Procedure Laterality Date   CLOSED REDUCTION FOREARM FRACTURE  09/28/2009   left distal radius/ulna   TONSILLECTOMY     TONSILLECTOMY AND ADENOIDECTOMY  11/27/2011   Procedure: TONSILLECTOMY AND ADENOIDECTOMY;  Surgeon: Ana LELON Moccasin, MD;  Location:  SURGERY CENTER;  Service: ENT;  Laterality: N/A;   UMBILICAL HERNIA REPAIR  03/14/2007   UPPER GASTROINTESTINAL ENDOSCOPY  12/02/2003   after toxic ingestion   Family History  Problem Relation Age of Onset   Healthy Mother    Sickle cell trait Father    Hypertension Maternal Grandmother    Diabetes Maternal Grandmother    Diabetes Maternal Grandfather    Social History   Tobacco Use   Smoking status: Former    Types: E-cigarettes   Smokeless tobacco: Never   Tobacco comments:    inside smokers at home  Vaping Use   Vaping status: Some Days   Substances: Nicotine, Flavoring  Substance Use Topics    Alcohol use: Not Currently   Drug use: Not Currently    Types: Marijuana    Comment: quit when found out preg   No Known Allergies Medications Prior to Admission  Medication Sig Dispense Refill Last Dose/Taking   cetirizine  (ZYRTEC ) 10 MG tablet Take 1 tablet (10 mg total) by mouth daily. 30 tablet 3 11/26/2023   ferrous sulfate  325 (65 FE) MG tablet Take 1 tablet (325 mg total) by mouth daily with breakfast. 90 tablet 1 11/26/2023 at  2:00 PM   fluticasone  (FLONASE ) 50 MCG/ACT nasal spray Place 1 spray into both nostrils 2 (two) times daily. 16 g 2 11/26/2023   acetaminophen  (TYLENOL ) 325 MG tablet Take 650 mg by mouth every 6 (six) hours as needed.      aspirin  EC 81 MG tablet Take 1 tablet (81 mg total) by mouth daily. Swallow whole. (Patient not taking: Reported on 11/21/2023) 90 tablet 3    chlorhexidine  (PERIDEX ) 0.12 % solution Gargle and spit 15 mL twice daily for the next 10 days or until symptoms improve. 200 mL 0    cyclobenzaprine  (FLEXERIL ) 10 MG tablet Take 1 tablet (10 mg total) by mouth 3 (three) times daily as needed (headache). 15 tablet 0 More than a month   ondansetron  (ZOFRAN ) 4 MG tablet  Take 1 tablet (4 mg total) by mouth every 8 (eight) hours as needed. (Patient not taking: Reported on 11/21/2023) 20 tablet 1    Prenatal Vit-Fe Fumarate-FA (MULTIVITAMIN-PRENATAL) 27-0.8 MG TABS tablet Take 1 tablet by mouth daily at 12 noon.       I have reviewed patient's Past Medical Hx, Surgical Hx, Family Hx, Social Hx, medications and allergies.   ROS  Pertinent items noted in HPI and remainder of comprehensive ROS otherwise negative.   PHYSICAL EXAM  Patient Vitals for the past 24 hrs:  BP Temp Temp src Pulse Resp SpO2  11/26/23 1809 132/77 98.4 F (36.9 C) Oral (!) 123 19 99 %    Constitutional: Well-developed, well-nourished female in no acute distress.  HEENT: atraumatic, normocephalic. Neck has normal ROM. EOM intact. Cardiovascular: normal rate & rhythm, warm and  well-perfused Respiratory: normal effort, no problems with respiration noted GI: Abd soft, non-tender, non-distended MSK: Extremities nontender, no edema, normal ROM Skin: warm and dry. Acyanotic, no jaundice or pallor. Neurologic: Alert and oriented x 4. No abnormal coordination. Psychiatric: Normal mood. Speech not slurred, not rapid/pressured. Patient is cooperative. GU: no CVA tenderness  Fetal Tracing: Baseline FHR: 150 per minute Fetal heart variability: moderate Fetal Heart Rate accelerations: yes Fetal Heart Rate decelerations: none Fetal Non-stress Test: Category I (reactive) Toco: uterine irritability   Labs: Results for orders placed or performed during the hospital encounter of 11/26/23 (from the past 24 hours)  CBC     Status: Abnormal   Collection Time: 11/26/23  6:43 PM  Result Value Ref Range   WBC 8.4 4.0 - 10.5 K/uL   RBC 4.17 3.87 - 5.11 MIL/uL   Hemoglobin 10.6 (L) 12.0 - 15.0 g/dL   HCT 67.4 (L) 63.9 - 53.9 %   MCV 77.9 (L) 80.0 - 100.0 fL   MCH 25.4 (L) 26.0 - 34.0 pg   MCHC 32.6 30.0 - 36.0 g/dL   RDW 85.6 88.4 - 84.4 %   Platelets 147 (L) 150 - 400 K/uL   nRBC 0.0 0.0 - 0.2 %  Comprehensive metabolic panel with GFR     Status: Abnormal   Collection Time: 11/26/23  6:43 PM  Result Value Ref Range   Sodium 136 135 - 145 mmol/L   Potassium 3.8 3.5 - 5.1 mmol/L   Chloride 104 98 - 111 mmol/L   CO2 22 22 - 32 mmol/L   Glucose, Bld 89 70 - 99 mg/dL   BUN <5 (L) 6 - 20 mg/dL   Creatinine, Ser 9.50 0.44 - 1.00 mg/dL   Calcium 8.9 8.9 - 89.6 mg/dL   Total Protein 6.4 (L) 6.5 - 8.1 g/dL   Albumin 2.9 (L) 3.5 - 5.0 g/dL   AST 12 (L) 15 - 41 U/L   ALT 12 0 - 44 U/L   Alkaline Phosphatase 72 38 - 126 U/L   Total Bilirubin 0.5 0.0 - 1.2 mg/dL   GFR, Estimated >39 >39 mL/min   Anion gap 10 5 - 15  Amylase     Status: None   Collection Time: 11/26/23  6:43 PM  Result Value Ref Range   Amylase 56 28 - 100 U/L  Lipase, blood     Status: None   Collection  Time: 11/26/23  6:43 PM  Result Value Ref Range   Lipase 29 11 - 51 U/L  Urinalysis, Routine w reflex microscopic -Urine, Clean Catch     Status: Abnormal   Collection Time: 11/26/23  6:56 PM  Result Value Ref Range   Color, Urine YELLOW YELLOW   APPearance HAZY (A) CLEAR   Specific Gravity, Urine 1.009 1.005 - 1.030   pH 6.0 5.0 - 8.0   Glucose, UA NEGATIVE NEGATIVE mg/dL   Hgb urine dipstick NEGATIVE NEGATIVE   Bilirubin Urine NEGATIVE NEGATIVE   Ketones, ur NEGATIVE NEGATIVE mg/dL   Protein, ur NEGATIVE NEGATIVE mg/dL   Nitrite NEGATIVE NEGATIVE   Leukocytes,Ua NEGATIVE NEGATIVE  Wet prep, genital     Status: None   Collection Time: 11/26/23  6:56 PM  Result Value Ref Range   Yeast Wet Prep HPF POC NONE SEEN NONE SEEN   Trich, Wet Prep NONE SEEN NONE SEEN   Clue Cells Wet Prep HPF POC NONE SEEN NONE SEEN   WBC, Wet Prep HPF POC <10 <10   Sperm NONE SEEN     Imaging:  No results found.  MDM & MAU COURSE  MDM: High  MAU Course: -Tachycardia, otherwise vitals within normal limits. -CMP, CBC, lipase/amylase to rule out GI causes of abdominal pain. -UA and wet prep to rule out infection. -CBC shows anemia with Hgb 10.6. Continue PO iron supplements. -CBC with ongoing anemia. CMP within normal limits for pregnancy. -UA and wet prep negative for infection. -On reassessment, nausea and heartburn have improved.   Differential diagnosis considered for abdominal pain includes but is not limited to: round ligament pain, labor, UTI, pyelonephritis, PID, cervicitis, placental abruption, chorioamnionitis   Orders Placed This Encounter  Procedures   Wet prep, genital   CBC   Comprehensive metabolic panel with GFR   Urinalysis, Routine w reflex microscopic -Urine, Clean Catch   Amylase   Lipase, blood   Meds ordered this encounter  Medications   metoCLOPramide  (REGLAN ) tablet 10 mg   famotidine  (PEPCID ) tablet 20 mg   famotidine  (PEPCID ) 20 MG tablet    Sig: Take 1 tablet  (20 mg total) by mouth at bedtime.    Dispense:  30 tablet    Refill:  2   metoCLOPramide  (REGLAN ) 10 MG tablet    Sig: Take 1 tablet (10 mg total) by mouth every 6 (six) hours as needed for nausea.    Dispense:  30 tablet    Refill:  0    ASSESSMENT   1. Nausea and vomiting during pregnancy   2. Heartburn during pregnancy in third trimester   3. [redacted] weeks gestation of pregnancy     PLAN  Discharge home in stable condition with preterm labor precautions.  Sent prescriptions for Reglan  and famotidine  to the pharmacy.     Allergies as of 11/26/2023   No Known Allergies      Medication List     TAKE these medications    acetaminophen  325 MG tablet Commonly known as: TYLENOL  Take 650 mg by mouth every 6 (six) hours as needed.   aspirin  EC 81 MG tablet Take 1 tablet (81 mg total) by mouth daily. Swallow whole.   cetirizine  10 MG tablet Commonly known as: ZYRTEC  Take 1 tablet (10 mg total) by mouth daily.   chlorhexidine  0.12 % solution Commonly known as: Peridex  Gargle and spit 15 mL twice daily for the next 10 days or until symptoms improve.   cyclobenzaprine  10 MG tablet Commonly known as: FLEXERIL  Take 1 tablet (10 mg total) by mouth 3 (three) times daily as needed (headache).   famotidine  20 MG tablet Commonly known as: PEPCID  Take 1 tablet (20 mg total) by mouth at bedtime.   ferrous  sulfate 325 (65 FE) MG tablet Take 1 tablet (325 mg total) by mouth daily with breakfast.   fluticasone  50 MCG/ACT nasal spray Commonly known as: FLONASE  Place 1 spray into both nostrils 2 (two) times daily.   metoCLOPramide  10 MG tablet Commonly known as: REGLAN  Take 1 tablet (10 mg total) by mouth every 6 (six) hours as needed for nausea.   multivitamin-prenatal 27-0.8 MG Tabs tablet Take 1 tablet by mouth daily at 12 noon.   ondansetron  4 MG tablet Commonly known as: ZOFRAN  Take 1 tablet (4 mg total) by mouth every 8 (eight) hours as needed.        Joesph DELENA Sear, PA

## 2023-11-27 LAB — GC/CHLAMYDIA PROBE AMP (~~LOC~~) NOT AT ARMC
Chlamydia: NEGATIVE
Comment: NEGATIVE
Comment: NORMAL
Neisseria Gonorrhea: NEGATIVE

## 2023-12-05 ENCOUNTER — Encounter: Payer: MEDICAID | Admitting: Advanced Practice Midwife

## 2023-12-09 ENCOUNTER — Ambulatory Visit: Payer: MEDICAID | Admitting: Women's Health

## 2023-12-09 ENCOUNTER — Encounter: Payer: Self-pay | Admitting: Women's Health

## 2023-12-09 VITALS — BP 142/86 | HR 117 | Wt 260.0 lb

## 2023-12-09 DIAGNOSIS — Z3403 Encounter for supervision of normal first pregnancy, third trimester: Secondary | ICD-10-CM

## 2023-12-09 DIAGNOSIS — R030 Elevated blood-pressure reading, without diagnosis of hypertension: Secondary | ICD-10-CM

## 2023-12-09 DIAGNOSIS — Z3A34 34 weeks gestation of pregnancy: Secondary | ICD-10-CM | POA: Diagnosis not present

## 2023-12-09 NOTE — Progress Notes (Signed)
 LOW-RISK PREGNANCY VISIT Patient name: Krystal [name] MRN [id number]  Date of birth: 08/13/2000 Chief Complaint:   Routine Prenatal Visit  History of Present Illness:   Krystal [name] is a 23 y.o. G63P0000 female at [redacted]w[redacted]d with an Estimated Date of Delivery: 01/16/24 being seen today for ongoing management of a low-risk pregnancy.   Today she reports cramping. Denies ha, visual changes, ruq/epigastric pain. Hasn't been checking home bp's.   Contractions: Not present. Vag. Bleeding: None.  Movement: Present. denies leaking of fluid.     10/24/2023    9:10 AM 07/04/2023    2:25 PM  Depression screen PHQ 2/9  Decreased Interest 0 0  Down, Depressed, Hopeless 0 0  PHQ - 2 Score 0 0  Altered sleeping 2 3  Tired, decreased energy 2 3  Change in appetite 0 2  Feeling bad or failure about yourself  0 0  Trouble concentrating 0 0  Moving slowly or fidgety/restless 0 0  Suicidal thoughts 0 0  PHQ-9 Score 4 8  Difficult doing work/chores Not difficult at all         07/04/2023    2:25 PM  GAD 7 : Generalized Anxiety Score  Nervous, Anxious, on Edge 3  Control/stop worrying 3  Worry too much - different things 3  Trouble relaxing 3  Restless 1  Easily annoyed or irritable 3  Afraid - awful might happen 3  Total GAD 7 Score 19      Review of Systems:   Pertinent items are noted in HPI Denies abnormal vaginal discharge w/ itching/odor/irritation, headaches, visual changes, shortness of breath, chest pain, abdominal pain, severe nausea/vomiting, or problems with urination or bowel movements unless otherwise stated above. Pertinent History Reviewed:  Reviewed past medical,surgical, social, obstetrical and family history.  Reviewed problem list, medications and allergies. Physical Assessment:   Vitals:   12/09/23 1355 12/09/23 1404  BP: (!) 144/91 (!) 142/86  Pulse: (!) 120 (!) 117  Weight: 260 lb (117.9 kg)   Body mass index is 39.53 kg/m.        Physical Examination:    General appearance: Well appearing, and in no distress  Mental status: Alert, oriented to person, place, and time  Skin: Warm & dry  Cardiovascular: Normal heart rate noted  Respiratory: Normal respiratory effort, no distress  Abdomen: Soft, gravid, nontender  Pelvic: Cervical exam deferred         Extremities: Edema: None  Fetal Status: Fetal Heart Rate (bpm): 151 Fundal Height: 34 cm Movement: Present    Chaperone: N/A No results found for this or any previous visit (from the past 24 hours).  Assessment & Plan:  1) Low-risk pregnancy G1P0000 at [redacted]w[redacted]d with an Estimated Date of Delivery: 01/16/24   2) Elevated bp, voided before knowing we wanted urine specimen, asymptomatic. F/U tomorrow for bp check/nurse, check home bp's BID- bring log, reviewed pre-e s/s, reasons to seek care   Meds: No orders of the defined types were placed in this encounter.  Labs/procedures today: none  Plan:  Continue routine obstetrical care  Next visit: prefers in person    Reviewed: Preterm labor symptoms and general obstetric precautions including but not limited to vaginal bleeding, contractions, leaking of fluid and fetal movement were reviewed in detail with the patient.  All questions were answered. Does have home bp cuff. Office bp cuff given: not applicable. Check bp twice daily, let us  know if consistently >140 and/or >90.  Follow-up: Return in about  1 day (around 12/10/2023) for nurse bp check.  Future Appointments  Date Time Provider Department Center  12/10/2023  2:50 PM [facility] NURSE [facility]  12/19/2023 10:50 AM Krystal [name], CNM [facility]    No orders of the defined types were placed in this encounter.  Suzen [name] Krystal CNM, [facility] 12/09/2023 2:20 PM

## 2023-12-09 NOTE — Patient Instructions (Signed)
 Krystal [name], thank you for choosing our office today! We appreciate the opportunity to meet your healthcare needs. You may receive a short survey by mail, e-mail, or through [employer]. If you are happy with your care we would appreciate if you could take just a few minutes to complete the survey questions. We read all of your comments and take your feedback very seriously. Thank you again for choosing our office.  Center for [employer] Team at [facility]  [facility] at [facility] ([address] [place], [place] [address]) Entrance C, located off of [address] Free 24/7 valet parking   CLASSES: Go to [email or web address] to register for classes (childbirth, breastfeeding, waterbirth, infant CPR, daddy bootcamp, etc.)  Call the office [phone]) or go to [facility] if: You begin to have strong, frequent contractions Your water breaks.  Sometimes it is a big gush of fluid, sometimes it is just a trickle that keeps getting your panties wet or running down your legs You have vaginal bleeding.  It is normal to have a small amount of spotting if your cervix was checked.  You don't feel your baby moving like normal.  If you don't, get you something to eat and drink and lay down and focus on feeling your baby move.   If your baby is still not moving like normal, you should call the office or go to [facility].  Call the office [phone]) or go to [facility] hospital for these signs of pre-eclampsia: Severe headache that does not go away with Tylenol  Visual changes- seeing spots, double, blurred vision Pain under your right breast or upper abdomen that does not go away with Tums or heartburn medicine Nausea and/or vomiting Severe swelling in your hands, feet, and face   Tdap Vaccine It is recommended that you get the Tdap vaccine during the third trimester of EACH pregnancy to help protect your baby from getting pertussis (whooping cough) 27-36 weeks is the BEST time to do  this so that you can pass the protection on to your baby. During pregnancy is better than after pregnancy, but if you are unable to get it during pregnancy it will be offered at the hospital.  You can get this vaccine with us , at the health department, your family doctor, or some local pharmacies Everyone who will be around your baby should also be up-to-date on their vaccines before the baby comes. Adults (who are not pregnant) only need 1 dose of Tdap during adulthood.   [facility] Pediatricians/Family Doctors [facility]): [address] Luba BROCKS, [phone]           [facility]: [address], [phone]                [facility]): [address], [phone] (call to ask if accepting patients) [facility]: [address], [place], [phone]    [facility] Pediatricians/Family Doctors [facility]): [phone] [address], [phone] [facility]: [address] [place], [phone] [facility]: [address], [phone]  [facility] Doctors  [facility]): [phone] [facility]: [address], [phone]   [facility] Doctors [facility]: [address] [phone]  [facility] Doctors  [facility]: [phone], [phone]  Home Blood Pressure Monitoring for Patients   Your provider has recommended that you check your  blood pressure (BP) at least once a week at home. If you do not have a blood pressure cuff at home, one will be provided for you. Contact your provider if you have not received your monitor within 1 week.   Helpful Tips for Accurate Home Blood Pressure Checks  Don't smoke, exercise, or drink caffeine 30 minutes before checking your BP Use the restroom before checking your BP (a full bladder can raise your  pressure) Relax in a comfortable upright chair Feet on the ground Left arm resting comfortably on a flat surface at the level of your heart Legs uncrossed Back supported Sit quietly and don't talk Place the cuff on your bare arm Adjust snuggly, so that only two fingertips can fit between your skin and the top of the cuff Check 2 readings separated by at least one minute Keep a log of your BP readings For a visual, please reference this diagram: http://ccnc.care/bpdiagram  Provider Name: [facility]     Phone: [phone]  Zone 1: ALL CLEAR  Continue to monitor your symptoms:  BP reading is less than 140 (top number) or less than 90 (bottom number)  No right upper stomach pain No headaches or seeing spots No feeling nauseated or throwing up No swelling in face and hands  Zone 2: CAUTION Call your doctor's office for any of the following:  BP reading is greater than 140 (top number) or greater than 90 (bottom number)  Stomach pain under your ribs in the middle or right side Headaches or seeing spots Feeling nauseated or throwing up Swelling in face and hands  Zone 3: EMERGENCY  Seek immediate medical care if you have any of the following:  BP reading is greater than160 (top number) or greater than 110 (bottom number) Severe headaches not improving with Tylenol  Serious difficulty catching your breath Any worsening symptoms from Zone 2  Preterm Labor and Birth Information  The normal length of a pregnancy is 39-41 weeks. Preterm labor is when labor starts before 37 completed weeks of pregnancy. What are the risk factors for preterm labor? Preterm labor is more likely to occur in women who: Have certain infections during pregnancy such as a bladder infection, sexually transmitted infection, or infection inside the uterus (chorioamnionitis). Have a shorter-than-normal cervix. Have gone into preterm labor before. Have had surgery on their cervix. Are younger than age 38  or older than age 63. Are African American. Are pregnant with twins or multiple babies (multiple gestation). Take street drugs or smoke while pregnant. Do not gain enough weight while pregnant. Became pregnant shortly after having been pregnant. What are the symptoms of preterm labor? Symptoms of preterm labor include: Cramps similar to those that can happen during a menstrual period. The cramps may happen with diarrhea. Pain in the abdomen or lower back. Regular uterine contractions that may feel like tightening of the abdomen. A feeling of increased pressure in the pelvis. Increased watery or bloody mucus discharge from the vagina. Water breaking (ruptured amniotic sac). Why is it important to recognize signs of preterm labor? It is important to recognize signs of preterm labor because babies who are born prematurely may not be fully developed. This can put them at an increased risk for: Long-term (chronic) heart and lung problems. Difficulty immediately after birth with regulating body systems, including blood sugar, body temperature, heart rate, and breathing rate. Bleeding in the brain. Cerebral palsy. Learning difficulties. Death. These risks are highest for babies who are born before 34 weeks  of pregnancy. How is preterm labor treated? Treatment depends on the length of your pregnancy, your condition, and the health of your baby. It may involve: Having a stitch (suture) placed in your cervix to prevent your cervix from opening too early (cerclage). Taking or being given medicines, such as: Hormone medicines. These may be given early in pregnancy to help support the pregnancy. Medicine to stop contractions. Medicines to help mature the baby's lungs. These may be prescribed if the risk of delivery is high. Medicines to prevent your baby from developing cerebral palsy. If the labor happens before 34 weeks of pregnancy, you may need to stay in the hospital. What should I do if I  think I am in preterm labor? If you think that you are going into preterm labor, call your health care provider right away. How can I prevent preterm labor in future pregnancies? To increase your chance of having a full-term pregnancy: Do not use any tobacco products, such as cigarettes, chewing tobacco, and e-cigarettes. If you need help quitting, ask your health care provider. Do not use street drugs or medicines that have not been prescribed to you during your pregnancy. Talk with your health care provider before taking any herbal supplements, even if you have been taking them regularly. Make sure you gain a healthy amount of weight during your pregnancy. Watch for infection. If you think that you might have an infection, get it checked right away. Make sure to tell your health care provider if you have gone into preterm labor before. This information is not intended to replace advice given to you by your health care provider. Make sure you discuss any questions you have with your health care provider. Document Revised: 08/29/2018 Document Reviewed: 09/28/2015 Elsevier Patient Education  2020 [employer].

## 2023-12-10 ENCOUNTER — Ambulatory Visit (INDEPENDENT_AMBULATORY_CARE_PROVIDER_SITE_OTHER): Payer: MEDICAID

## 2023-12-10 VITALS — BP 118/81

## 2023-12-10 DIAGNOSIS — Z0130 Encounter for examination of blood pressure without abnormal findings: Secondary | ICD-10-CM

## 2023-12-10 DIAGNOSIS — Z3A34 34 weeks gestation of pregnancy: Secondary | ICD-10-CM

## 2023-12-10 DIAGNOSIS — Z3403 Encounter for supervision of normal first pregnancy, third trimester: Secondary | ICD-10-CM

## 2023-12-10 LAB — POCT URINALYSIS DIPSTICK OB
Blood, UA: NEGATIVE
Glucose, UA: NEGATIVE
Leukocytes, UA: NEGATIVE
Nitrite, UA: NEGATIVE
POC,PROTEIN,UA: NEGATIVE

## 2023-12-10 NOTE — Progress Notes (Addendum)
   NURSE VISIT- BLOOD PRESSURE CHECK  SUBJECTIVE:  Krystal [name] is a 23 y.o. G39P0000 female here for BP check. She is [redacted]w[redacted]d pregnant    HYPERTENSION ROS:  Pregnant Severe headaches that don't go away with tylenol /other medicines: No  Visual changes (seeing spots/double/blurred vision) No  Severe pain under right breast breast or in center of upper chest No  Severe nausea/vomiting No  Taking medicines as instructed not applicable    OBJECTIVE:  BP 118/81 (BP Location: Left Arm, Patient Position: Sitting, Cuff Size: Large)   LMP 04/11/2023   Appearance alert, well appearing, and in no distress.  ASSESSMENT: Pregnancy [redacted]w[redacted]d  blood pressure check  PLAN: Discussed with Krystal [name], CNM, [facility]   Recommendations: Check BP x2 daily   Follow-up: as scheduled   Krystal [name]  12/10/2023 3:23 PM

## 2023-12-19 ENCOUNTER — Ambulatory Visit: Payer: MEDICAID | Admitting: Women's Health

## 2023-12-19 ENCOUNTER — Other Ambulatory Visit (HOSPITAL_COMMUNITY)
Admission: RE | Admit: 2023-12-19 | Discharge: 2023-12-19 | Disposition: A | Discharge status: Home / Self Care | Payer: MEDICAID | Source: Ambulatory Visit | Attending: Women's Health | Admitting: Women's Health

## 2023-12-19 ENCOUNTER — Encounter: Payer: Self-pay | Admitting: Women's Health

## 2023-12-19 ENCOUNTER — Encounter (HOSPITAL_COMMUNITY): Payer: Self-pay

## 2023-12-19 VITALS — BP 121/87 | HR 108 | Wt 262.0 lb

## 2023-12-19 DIAGNOSIS — Z3403 Encounter for supervision of normal first pregnancy, third trimester: Secondary | ICD-10-CM

## 2023-12-19 DIAGNOSIS — O133 Gestational [pregnancy-induced] hypertension without significant proteinuria, third trimester: Secondary | ICD-10-CM | POA: Diagnosis not present

## 2023-12-19 DIAGNOSIS — O139 Gestational [pregnancy-induced] hypertension without significant proteinuria, unspecified trimester: Secondary | ICD-10-CM | POA: Insufficient documentation

## 2023-12-19 DIAGNOSIS — Z3A36 36 weeks gestation of pregnancy: Secondary | ICD-10-CM | POA: Diagnosis not present

## 2023-12-19 DIAGNOSIS — O0993 Supervision of high risk pregnancy, unspecified, third trimester: Secondary | ICD-10-CM

## 2023-12-19 DIAGNOSIS — Z113 Encounter for screening for infections with a predominantly sexual mode of transmission: Secondary | ICD-10-CM | POA: Insufficient documentation

## 2023-12-19 LAB — CERVICOVAGINAL ANCILLARY ONLY
Chlamydia: NEGATIVE
Comment: NEGATIVE
Comment: NORMAL
Neisseria Gonorrhea: NEGATIVE

## 2023-12-19 LAB — POCT URINALYSIS DIPSTICK OB
Blood, UA: NEGATIVE
Glucose, UA: NEGATIVE
Ketones, UA: NEGATIVE
Leukocytes, UA: NEGATIVE
Nitrite, UA: NEGATIVE
POC,PROTEIN,UA: NEGATIVE

## 2023-12-19 NOTE — Patient Instructions (Signed)
 Aariya, thank you for choosing our office today! We appreciate the opportunity to meet your healthcare needs. You may receive a short survey by mail, e-mail, or through [employer]. If you are happy with your care we would appreciate if you could take just a few minutes to complete the survey questions. We read all of your comments and take your feedback very seriously. Thank you again for choosing our office.  Center for [employer] Team at [facility]  [facility] at [facility] ([address] [place], [place] [address]) Entrance C, located off of [address] Free 24/7 valet parking   Your induction is scheduled for 8/8. Please DO NOT show up at the time you see in MyChart. Someone from Labor & Delivery will call you on the date of your induction to let you know what time to come in. Please keep your phone on and with you at all times, you have 1 hour to respond to them to let them know you are on your way.  Go to the main desk at the [facility] and let them know you are there to be induced. They will send someone from Labor & Delivery to come get you.  You will get a call from a nurse from the hospital within the next day or so to go over some information. If you have any questions, please let us  know.    CLASSES: Go to [email or web address] to register for classes (childbirth, breastfeeding, waterbirth, infant CPR, daddy bootcamp, etc.)  Call the office [phone]) or go to [facility] if: You begin to have strong, frequent contractions Your water breaks.  Sometimes it is a big gush of fluid, sometimes it is just a trickle that keeps getting your panties wet or running down your legs You have vaginal bleeding.  It is normal to have a small amount of spotting if your cervix was checked.  You don't feel your baby moving like normal.  If you don't, get you something to eat and drink and lay down and focus on feeling your baby move.   If your baby is still not  moving like normal, you should call the office or go to [facility].  Call the office [phone]) or go to [facility] hospital for these signs of pre-eclampsia: Severe headache that does not go away with Tylenol  Visual changes- seeing spots, double, blurred vision Pain under your right breast or upper abdomen that does not go away with Tums or heartburn medicine Nausea and/or vomiting Severe swelling in your hands, feet, and face   Tdap Vaccine It is recommended that you get the Tdap vaccine during the third trimester of EACH pregnancy to help protect your baby from getting pertussis (whooping cough) 27-36 weeks is the BEST time to do this so that you can pass the protection on to your baby. During pregnancy is better than after pregnancy, but if you are unable to get it during pregnancy it will be offered at the hospital.  You can get this vaccine with us , at the health department, your family doctor, or some local pharmacies Everyone who will be around your baby should also be up-to-date on their vaccines before the baby comes. Adults (who are not pregnant) only need 1 dose of Tdap during adulthood.   [facility] Pediatricians/Family Doctors [facility]): [address] Luba BROCKS, [phone]           [facility]: [address], [phone]  [facility]): [address], [phone] (call to ask if accepting patients) [facility]: [address], [place], [phone]    [facility] Pediatricians/Family Doctors [facility]): [phone] [address], [phone] [facility]: [address] [place], [phone] [facility]: [address], [phone]  [facility] Doctors  [facility]): [phone] [facility]: [address] [phone]   [facility]  Doctors [facility]: [address] [phone]  [facility] Doctors  [facility]: [phone], [phone]  Home Blood Pressure Monitoring for Patients   Your provider has recommended that you check your blood pressure (BP) at least once a week at home. If you do not have a blood pressure cuff at home, one will be provided for you. Contact your provider if you have not received your monitor within 1 week.   Helpful Tips for Accurate Home Blood Pressure Checks  Don't smoke, exercise, or drink caffeine 30 minutes before checking your BP Use the restroom before checking your BP (a full bladder can raise your pressure) Relax in a comfortable upright chair Feet on the ground Left arm resting comfortably on a flat surface at the level of your heart Legs uncrossed Back supported Sit quietly and don't talk Place the cuff on your bare arm Adjust snuggly, so that only two fingertips can fit between your skin and the top of the cuff Check 2 readings separated by at least one minute Keep a log of your BP readings For a visual, please reference this diagram: http://ccnc.care/bpdiagram  Provider Name: [facility]     Phone: [phone]  Zone 1: ALL CLEAR  Continue to monitor your symptoms:  BP reading is less than 140 (top number) or less than 90 (bottom number)  No right upper stomach pain No headaches or seeing spots No feeling nauseated or throwing up No swelling in face and hands  Zone 2: CAUTION Call your doctor's office for any of the following:  BP reading is greater than 140 (top number) or greater than 90 (bottom number)  Stomach pain under your ribs in the middle or right side Headaches or seeing spots Feeling nauseated or throwing up Swelling in face and hands  Zone 3: EMERGENCY  Seek immediate medical care if you have any of the following:  BP reading is greater than160 (top number) or greater than 110 (bottom number) Severe  headaches not improving with Tylenol  Serious difficulty catching your breath Any worsening symptoms from Zone 2  Preterm Labor and Birth Information  The normal length of a pregnancy is 39-41 weeks. Preterm labor is when labor starts before 37 completed weeks of pregnancy. What are the risk factors for preterm labor? Preterm labor is more likely to occur in women who: Have certain infections during pregnancy such as a bladder infection, sexually transmitted infection, or infection inside the uterus (chorioamnionitis). Have a shorter-than-normal cervix. Have gone into preterm labor before. Have had surgery on their cervix. Are younger than age 68 or older than age 23. Are African American. Are pregnant with twins or multiple babies (multiple gestation). Take street drugs or smoke while pregnant. Do not gain enough weight while pregnant. Became pregnant shortly after having been pregnant. What are the symptoms of preterm labor? Symptoms of preterm labor include: Cramps similar to those that can happen during a menstrual period. The cramps may happen with diarrhea.  Pain in the abdomen or lower back. Regular uterine contractions that may feel like tightening of the abdomen. A feeling of increased pressure in the pelvis. Increased watery or bloody mucus discharge from the vagina. Water breaking (ruptured amniotic sac). Why is it important to recognize signs of preterm labor? It is important to recognize signs of preterm labor because babies who are born prematurely may not be fully developed. This can put them at an increased risk for: Long-term (chronic) heart and lung problems. Difficulty immediately after birth with regulating body systems, including blood sugar, body temperature, heart rate, and breathing rate. Bleeding in the brain. Cerebral palsy. Learning difficulties. Death. These risks are highest for babies who are born before 34 weeks of pregnancy. How is preterm labor  treated? Treatment depends on the length of your pregnancy, your condition, and the health of your baby. It may involve: Having a stitch (suture) placed in your cervix to prevent your cervix from opening too early (cerclage). Taking or being given medicines, such as: Hormone medicines. These may be given early in pregnancy to help support the pregnancy. Medicine to stop contractions. Medicines to help mature the baby's lungs. These may be prescribed if the risk of delivery is high. Medicines to prevent your baby from developing cerebral palsy. If the labor happens before 34 weeks of pregnancy, you may need to stay in the hospital. What should I do if I think I am in preterm labor? If you think that you are going into preterm labor, call your health care provider right away. How can I prevent preterm labor in future pregnancies? To increase your chance of having a full-term pregnancy: Do not use any tobacco products, such as cigarettes, chewing tobacco, and e-cigarettes. If you need help quitting, ask your health care provider. Do not use street drugs or medicines that have not been prescribed to you during your pregnancy. Talk with your health care provider before taking any herbal supplements, even if you have been taking them regularly. Make sure you gain a healthy amount of weight during your pregnancy. Watch for infection. If you think that you might have an infection, get it checked right away. Make sure to tell your health care provider if you have gone into preterm labor before. This information is not intended to replace advice given to you by your health care provider. Make sure you discuss any questions you have with your health care provider. Document Revised: 08/29/2018 Document Reviewed: 09/28/2015 Elsevier Patient Education  2020 [employer].

## 2023-12-19 NOTE — Progress Notes (Signed)
 HIGH-RISK PREGNANCY VISIT Patient name: Krystal [name] MRN [id number]  Date of birth: [birth year] Chief Complaint:   Routine Prenatal Visit  History of Present Illness:   Krystal [name] is a 23 y.o. G76P0000 female at [redacted]w[redacted]d with an Estimated Date of Delivery: 01/16/24 being seen today for ongoing management of a high-risk pregnancy complicated by gestational hypertension currently on no meds, dx today.    Today she reports no complaints. Denies ha, visual changes, ruq/epigastric pain, n/v.   Contractions: Not present. Vag. Bleeding: None.  Movement: Present. denies leaking of fluid.      10/24/2023    9:10 AM 07/04/2023    2:25 PM  Depression screen PHQ 2/9  Decreased Interest 0 0  Down, Depressed, Hopeless 0 0  PHQ - 2 Score 0 0  Altered sleeping 2 3  Tired, decreased energy 2 3  Change in appetite 0 2  Feeling bad or failure about yourself  0 0  Trouble concentrating 0 0  Moving slowly or fidgety/restless 0 0  Suicidal thoughts 0 0  PHQ-9 Score 4 8  Difficult doing work/chores Not difficult at all         07/04/2023    2:25 PM  GAD 7 : Generalized Anxiety Score  Nervous, Anxious, on Edge 3  Control/stop worrying 3  Worry too much - different things 3  Trouble relaxing 3  Restless 1  Easily annoyed or irritable 3  Afraid - awful might happen 3  Total GAD 7 Score 19     Review of Systems:   Pertinent items are noted in HPI Denies abnormal vaginal discharge w/ itching/odor/irritation, headaches, visual changes, shortness of breath, chest pain, abdominal pain, severe nausea/vomiting, or problems with urination or bowel movements unless otherwise stated above. Pertinent History Reviewed:  Reviewed past medical,surgical, social, obstetrical and family history.  Reviewed problem list, medications and allergies. Physical Assessment:   Vitals:   12/19/23 1055 12/19/23 1128  BP: (!) 135/90 121/87  Pulse: (!) 108   Weight: 262 lb (118.8 kg)   Body mass index is 39.84  kg/m.           Physical Examination:   General appearance: alert, well appearing, and in no distress  Mental status: alert, oriented to person, place, and time  Skin: warm & dry   Extremities:      Cardiovascular: normal heart rate noted  Respiratory: normal respiratory effort, no distress  Abdomen: gravid, soft, non-tender  Pelvic: swabs obtained, declined SVE vtx by [place]        Fetal Status:     Movement: Present    Fetal Surveillance Testing today: NST: FHR baseline 135 bpm, Variability: moderate, Accelerations:present, Decelerations:  Absent= Cat 1/reactive Toco: irregular, not perceived by pt    Chaperone: Alan Fischer  Results for orders placed or performed in visit on 12/19/23 (from the past 24 hours)  POC Urinalysis Dipstick OB   Collection Time: 12/19/23 11:03 AM  Result Value Ref Range   Color, UA     Clarity, UA     Glucose, UA Negative Negative   Bilirubin, UA     Ketones, UA neg    Spec Grav, UA     Blood, UA neg    pH, UA     POC,PROTEIN,UA Negative Negative, Trace, Small (1+), Moderate (2+), Large (3+), 4+   Urobilinogen, UA     Nitrite, UA neg    Leukocytes, UA Negative Negative   Appearance     Odor  Assessment & Plan:  High-risk pregnancy: G1P0000 at [redacted]w[redacted]d with an Estimated Date of Delivery: 01/16/24   1) GHTN, dx today (elevated bp's last visit as well-repeat next day at nurse visit was nl) asymptomatic, no proteinuria, reactive NST. Will get labs, start 2x/wk testing, IOL 37wk (nothing available 8/7, scheduled for 8/8 AM).  IOL form faxed and orders placed. Check home bp's bid, reviewed pre-e s/s, reasons to seek care  Meds: No orders of the defined types were placed in this encounter.  Labs/procedures today: GBS, GC/CT, and NST  Treatment Plan:  2x/wk testing (no bpp/dopp u/s available), IOL 37w  Reviewed: Preterm labor symptoms and general obstetric precautions including but not limited to vaginal bleeding, contractions, leaking of  fluid and fetal movement were reviewed in detail with the patient.  All questions were answered. Does have home bp cuff. Office bp cuff given: not applicable.   Follow-up: Return for mon nst/nurse; thurs nst/hrob w/ md/cnm for next week only.   Future Appointments  Date Time Provider Department Center  12/23/2023  2:50 PM [facility] NURSE [facility]  12/26/2023  1:50 PM [facility] NURSE [facility]  12/26/2023  2:10 PM Cresenzo-Dishmon, Cathlean, CNM [facility]  12/27/2023  6:45 AM [facility] SCHED ROOM [facility] None    Orders Placed This Encounter  Procedures   Culture, beta strep (group b only)   CBC   Comprehensive metabolic panel with GFR   Protein / creatinine ratio, urine   POC Urinalysis Dipstick OB   Suzen JONELLE Fetters CNM, [facility] 12/19/2023 11:59 AM

## 2023-12-20 ENCOUNTER — Telehealth (HOSPITAL_COMMUNITY): Payer: Self-pay | Admitting: *Deleted

## 2023-12-20 LAB — COMPREHENSIVE METABOLIC PANEL WITH GFR
ALT: 6 IU/L (ref 0–32)
AST: 10 IU/L (ref 0–40)
Albumin: 3.7 g/dL — ABNORMAL LOW (ref 4.0–5.0)
Alkaline Phosphatase: 118 IU/L (ref 44–121)
BUN/Creatinine Ratio: 10 (ref 9–23)
BUN: 5 mg/dL — ABNORMAL LOW (ref 6–20)
Bilirubin Total: 0.3 mg/dL (ref 0.0–1.2)
CO2: 18 mmol/L — ABNORMAL LOW (ref 20–29)
Calcium: 9 mg/dL (ref 8.7–10.2)
Chloride: 104 mmol/L (ref 96–106)
Creatinine, Ser: 0.5 mg/dL — ABNORMAL LOW (ref 0.57–1.00)
Globulin, Total: 2.6 g/dL (ref 1.5–4.5)
Glucose: 79 mg/dL (ref 70–99)
Potassium: 3.6 mmol/L (ref 3.5–5.2)
Sodium: 138 mmol/L (ref 134–144)
Total Protein: 6.3 g/dL (ref 6.0–8.5)
eGFR: 137 mL/min/1.73 (ref >59)

## 2023-12-20 LAB — CBC
Hematocrit: 36.7 % (ref 34.0–46.6)
Hemoglobin: 11.3 g/dL (ref 11.1–15.9)
MCH: 25.7 pg — ABNORMAL LOW (ref 26.6–33.0)
MCHC: 30.8 g/dL — ABNORMAL LOW (ref 31.5–35.7)
MCV: 83 fL (ref 79–97)
Platelets: 132 x10E3/uL — ABNORMAL LOW (ref 150–450)
RBC: 4.4 x10E6/uL (ref 3.77–5.28)
RDW: 15.5 % — ABNORMAL HIGH (ref 11.7–15.4)
WBC: 7.5 x10E3/uL (ref 3.4–10.8)

## 2023-12-20 LAB — PROTEIN / CREATININE RATIO, URINE
Creatinine, Urine: 43.3 mg/dL
Protein, Ur: 7.7 mg/dL
Protein/Creat Ratio: 178 mg/g{creat} (ref 0–200)

## 2023-12-20 NOTE — Telephone Encounter (Signed)
 Preadmission screen

## 2023-12-23 ENCOUNTER — Ambulatory Visit: Payer: Self-pay | Admitting: Adult Health

## 2023-12-23 ENCOUNTER — Ambulatory Visit: Payer: MEDICAID | Admitting: *Deleted

## 2023-12-23 VITALS — BP 136/84 | HR 88

## 2023-12-23 DIAGNOSIS — O133 Gestational [pregnancy-induced] hypertension without significant proteinuria, third trimester: Secondary | ICD-10-CM

## 2023-12-23 DIAGNOSIS — Z3A36 36 weeks gestation of pregnancy: Secondary | ICD-10-CM

## 2023-12-23 DIAGNOSIS — O0990 Supervision of high risk pregnancy, unspecified, unspecified trimester: Secondary | ICD-10-CM

## 2023-12-23 LAB — POCT URINALYSIS DIPSTICK OB
Blood, UA: NEGATIVE
Glucose, UA: NEGATIVE
Ketones, UA: NEGATIVE
Leukocytes, UA: NEGATIVE
Nitrite, UA: NEGATIVE
POC,PROTEIN,UA: NEGATIVE

## 2023-12-23 LAB — CULTURE, BETA STREP (GROUP B ONLY)

## 2023-12-23 NOTE — Progress Notes (Addendum)
   NURSE VISIT- NST  SUBJECTIVE:  Krystal [name] is a 23 y.o. G1P0000 female at [redacted]w[redacted]d, here for a NST for pregnancy complicated by [facility].  She reports active fetal movement, contractions: none, vaginal bleeding: none, membranes: intact.   OBJECTIVE:  BP 136/84   Pulse 88   LMP 04/11/2023   Appears well, no apparent distress  Results for orders placed or performed in visit on 12/23/23 (from the past 24 hours)  POC Urinalysis Dipstick OB   Collection Time: 12/23/23  3:01 PM  Result Value Ref Range   Color, UA     Clarity, UA     Glucose, UA Negative Negative   Bilirubin, UA     Ketones, UA neg    Spec Grav, UA     Blood, UA neg    pH, UA     POC,PROTEIN,UA Negative Negative, Trace, Small (1+), Moderate (2+), Large (3+), 4+   Urobilinogen, UA     Nitrite, UA neg    Leukocytes, UA Negative Negative   Appearance     Odor      NST: FHR baseline 150 bpm, Variability: moderate, Accelerations:present, Decelerations:  Absent= Cat 1/reactive Toco: none   ASSESSMENT: G1P0000 at [redacted]w[redacted]d with GHTN NST reactive  PLAN: EFM strip reviewed by Sherrell Ely, CNM   Recommendations: keep next appointment as scheduled    Krystal [name]  12/23/2023 3:29 PM   Chart reviewed for nurse visit. Agree with plan of care.  Ely Mering, [place] 12/23/2023 4:38 PM

## 2023-12-24 ENCOUNTER — Telehealth (HOSPITAL_COMMUNITY): Payer: Self-pay | Admitting: *Deleted

## 2023-12-24 ENCOUNTER — Encounter (HOSPITAL_COMMUNITY): Payer: Self-pay | Admitting: *Deleted

## 2023-12-24 NOTE — Telephone Encounter (Signed)
 Preadmission screen

## 2023-12-26 ENCOUNTER — Encounter: Payer: Self-pay | Admitting: Advanced Practice Midwife

## 2023-12-26 ENCOUNTER — Other Ambulatory Visit: Payer: MEDICAID

## 2023-12-26 ENCOUNTER — Ambulatory Visit: Payer: MEDICAID | Admitting: Advanced Practice Midwife

## 2023-12-26 VITALS — BP 118/82 | HR 123 | Wt 262.0 lb

## 2023-12-26 DIAGNOSIS — O0993 Supervision of high risk pregnancy, unspecified, third trimester: Secondary | ICD-10-CM | POA: Diagnosis not present

## 2023-12-26 DIAGNOSIS — Z3A37 37 weeks gestation of pregnancy: Secondary | ICD-10-CM | POA: Diagnosis not present

## 2023-12-26 DIAGNOSIS — O0990 Supervision of high risk pregnancy, unspecified, unspecified trimester: Secondary | ICD-10-CM

## 2023-12-26 DIAGNOSIS — O133 Gestational [pregnancy-induced] hypertension without significant proteinuria, third trimester: Secondary | ICD-10-CM

## 2023-12-26 NOTE — Progress Notes (Signed)
 HIGH-RISK PREGNANCY VISIT Patient name: Krystal [name] MRN [id number]  Date of birth: [birth year] Chief Complaint:   Routine Prenatal Visit and Non-stress Test  History of Present Illness:   Krystal [name] is a 23 y.o. G35P0000 female at [redacted]w[redacted]d with an Estimated Date of Delivery: 01/16/24 being seen today for ongoing management of a high-risk pregnancy complicated by gestational hypertension currently on no meds.    Today she reports no complaints. Contractions: Not present.  .  Movement: Present. denies leaking of fluid.      10/24/2023    9:10 AM 07/04/2023    2:25 PM  Depression screen PHQ 2/9  Decreased Interest 0 0  Down, Depressed, Hopeless 0 0  PHQ - 2 Score 0 0  Altered sleeping 2 3  Tired, decreased energy 2 3  Change in appetite 0 2  Feeling bad or failure about yourself  0 0  Trouble concentrating 0 0  Moving slowly or fidgety/restless 0 0  Suicidal thoughts 0 0  PHQ-9 Score 4 8  Difficult doing work/chores Not difficult at all         07/04/2023    2:25 PM  GAD 7 : Generalized Anxiety Score  Nervous, Anxious, on Edge 3  Control/stop worrying 3  Worry too much - different things 3  Trouble relaxing 3  Restless 1  Easily annoyed or irritable 3  Afraid - awful might happen 3  Total GAD 7 Score 19     Review of Systems:   Pertinent items are noted in HPI Denies abnormal vaginal discharge w/ itching/odor/irritation, headaches, visual changes, shortness of breath, chest pain, abdominal pain, severe nausea/vomiting, or problems with urination or bowel movements unless otherwise stated above. Pertinent History Reviewed:  Reviewed past medical,surgical, social, obstetrical and family history.  Reviewed problem list, medications and allergies. Physical Assessment:   Vitals:   12/26/23 1332  BP: 118/82  Pulse: (!) 123  Weight: 262 lb (118.8 kg)  Body mass index is 39.84 kg/m.           Physical Examination:   General appearance: alert, well appearing, and  in no distress  Mental status: alert, oriented to person, place, and time  Skin: warm & dry   Extremities: Edema: None    Cardiovascular: normal heart rate noted  Respiratory: normal respiratory effort, no distress  Abdomen: gravid, soft, non-tender  Pelvic: Cervical exam deferred         Chaperone: N/A    Fetal Status:     Movement: Present    Fetal Surveillance Testing today: NST: FHR baseline 125 bpm, Variability: moderate, Accelerations:present, Decelerations:  Absent= Cat 1/Reactive      No results found for this or any previous visit (from the past 24 hours).  Assessment & Plan:  High-risk pregnancy: G1P0000 at [redacted]w[redacted]d with an Estimated Date of Delivery: 01/16/24   1. Supervision of high risk pregnancy, antepartum (Primary)   2. [redacted] weeks gestation of pregnancy   3. Gestational hypertension, third trimester IOL set for the am    Meds: No orders of the defined types were placed in this encounter.   Orders: No orders of the defined types were placed in this encounter.    Labs/procedures today: NST    Future Appointments  Date Time Provider Department Center  12/27/2023  6:45 AM [facility] SCHED ROOM [facility] None    No orders of the defined types were placed in this encounter.  Cathlean Ely , DNP, CNM [facility]  12/26/2023 1:49 PM

## 2023-12-27 ENCOUNTER — Inpatient Hospital Stay (HOSPITAL_COMMUNITY)
Admission: RE | Admit: 2023-12-27 | Discharge: 2024-01-01 | DRG: 786 | Disposition: A | Discharge status: Home / Self Care | Payer: MEDICAID | Attending: Obstetrics & Gynecology | Admitting: Obstetrics & Gynecology

## 2023-12-27 ENCOUNTER — Other Ambulatory Visit: Payer: Self-pay

## 2023-12-27 ENCOUNTER — Inpatient Hospital Stay (HOSPITAL_COMMUNITY): Payer: MEDICAID

## 2023-12-27 ENCOUNTER — Encounter (HOSPITAL_COMMUNITY): Payer: Self-pay | Admitting: Obstetrics & Gynecology

## 2023-12-27 DIAGNOSIS — O26893 Other specified pregnancy related conditions, third trimester: Secondary | ICD-10-CM | POA: Diagnosis present

## 2023-12-27 DIAGNOSIS — Z3A37 37 weeks gestation of pregnancy: Secondary | ICD-10-CM

## 2023-12-27 DIAGNOSIS — D62 Acute posthemorrhagic anemia: Secondary | ICD-10-CM | POA: Diagnosis not present

## 2023-12-27 DIAGNOSIS — E66813 Obesity, class 3: Secondary | ICD-10-CM | POA: Diagnosis present

## 2023-12-27 DIAGNOSIS — O99214 Obesity complicating childbirth: Secondary | ICD-10-CM | POA: Diagnosis present

## 2023-12-27 DIAGNOSIS — O411230 Chorioamnionitis, third trimester, not applicable or unspecified: Secondary | ICD-10-CM | POA: Diagnosis present

## 2023-12-27 DIAGNOSIS — O9081 Anemia of the puerperium: Secondary | ICD-10-CM | POA: Diagnosis not present

## 2023-12-27 DIAGNOSIS — O134 Gestational [pregnancy-induced] hypertension without significant proteinuria, complicating childbirth: Principal | ICD-10-CM | POA: Diagnosis present

## 2023-12-27 DIAGNOSIS — F329 Major depressive disorder, single episode, unspecified: Secondary | ICD-10-CM | POA: Diagnosis present

## 2023-12-27 DIAGNOSIS — Z8249 Family history of ischemic heart disease and other diseases of the circulatory system: Secondary | ICD-10-CM

## 2023-12-27 DIAGNOSIS — O98919 Unspecified maternal infectious and parasitic disease complicating pregnancy, unspecified trimester: Secondary | ICD-10-CM | POA: Diagnosis not present

## 2023-12-27 DIAGNOSIS — D563 Thalassemia minor: Secondary | ICD-10-CM | POA: Diagnosis present

## 2023-12-27 DIAGNOSIS — O99344 Other mental disorders complicating childbirth: Secondary | ICD-10-CM | POA: Diagnosis not present

## 2023-12-27 DIAGNOSIS — Z98891 History of uterine scar from previous surgery: Secondary | ICD-10-CM

## 2023-12-27 DIAGNOSIS — Z87891 Personal history of nicotine dependence: Secondary | ICD-10-CM | POA: Diagnosis not present

## 2023-12-27 DIAGNOSIS — O9962 Diseases of the digestive system complicating childbirth: Secondary | ICD-10-CM | POA: Diagnosis present

## 2023-12-27 DIAGNOSIS — Z148 Genetic carrier of other disease: Secondary | ICD-10-CM | POA: Diagnosis not present

## 2023-12-27 DIAGNOSIS — Z3A00 Weeks of gestation of pregnancy not specified: Secondary | ICD-10-CM | POA: Diagnosis not present

## 2023-12-27 DIAGNOSIS — K219 Gastro-esophageal reflux disease without esophagitis: Secondary | ICD-10-CM | POA: Diagnosis present

## 2023-12-27 DIAGNOSIS — Z833 Family history of diabetes mellitus: Secondary | ICD-10-CM | POA: Diagnosis not present

## 2023-12-27 DIAGNOSIS — O133 Gestational [pregnancy-induced] hypertension without significant proteinuria, third trimester: Secondary | ICD-10-CM

## 2023-12-27 DIAGNOSIS — F418 Other specified anxiety disorders: Secondary | ICD-10-CM | POA: Diagnosis present

## 2023-12-27 DIAGNOSIS — O0993 Supervision of high risk pregnancy, unspecified, third trimester: Secondary | ICD-10-CM

## 2023-12-27 DIAGNOSIS — O139 Gestational [pregnancy-induced] hypertension without significant proteinuria, unspecified trimester: Principal | ICD-10-CM | POA: Diagnosis present

## 2023-12-27 LAB — CBC
HCT: 35.9 % — ABNORMAL LOW (ref 36.0–46.0)
Hemoglobin: 11.5 g/dL — ABNORMAL LOW (ref 12.0–15.0)
MCH: 25.7 pg — ABNORMAL LOW (ref 26.0–34.0)
MCHC: 32 g/dL (ref 30.0–36.0)
MCV: 80.1 fL (ref 80.0–100.0)
Platelets: 122 K/uL — ABNORMAL LOW (ref 150–400)
RBC: 4.48 MIL/uL (ref 3.87–5.11)
RDW: 15.4 % (ref 11.5–15.5)
WBC: 5.7 K/uL (ref 4.0–10.5)
nRBC: 0 % (ref 0.0–0.2)

## 2023-12-27 LAB — COMPREHENSIVE METABOLIC PANEL WITH GFR
ALT: 11 U/L (ref 0–44)
AST: 14 U/L — ABNORMAL LOW (ref 15–41)
Albumin: 2.9 g/dL — ABNORMAL LOW (ref 3.5–5.0)
Alkaline Phosphatase: 107 U/L (ref 38–126)
Anion gap: 11 (ref 5–15)
BUN: <5 mg/dL — ABNORMAL LOW (ref 6–20)
CO2: 20 mmol/L — ABNORMAL LOW (ref 22–32)
Calcium: 8.8 mg/dL — ABNORMAL LOW (ref 8.9–10.3)
Chloride: 108 mmol/L (ref 98–111)
Creatinine, Ser: 0.53 mg/dL (ref 0.44–1.00)
GFR, Estimated: >60 mL/min (ref >60)
Glucose, Bld: 85 mg/dL (ref 70–99)
Potassium: 3.6 mmol/L (ref 3.5–5.1)
Sodium: 139 mmol/L (ref 135–145)
Total Bilirubin: 0.5 mg/dL (ref 0.0–1.2)
Total Protein: 6.4 g/dL — ABNORMAL LOW (ref 6.5–8.1)

## 2023-12-27 LAB — PROTEIN / CREATININE RATIO, URINE
Creatinine, Urine: 52 mg/dL
Total Protein, Urine: <6 mg/dL

## 2023-12-27 LAB — TYPE AND SCREEN
ABO/RH(D): A POS
Antibody Screen: NEGATIVE

## 2023-12-27 MED ORDER — LACTATED RINGERS IV SOLN: INTRAVENOUS | Status: DC

## 2023-12-27 MED ORDER — LABETALOL HCL 5 MG/ML IV SOLN: 40.0000 mg | INTRAVENOUS | Status: DC | PRN

## 2023-12-27 MED ORDER — HYDROXYZINE HCL 50 MG PO TABS: 50.0000 mg | ORAL_TABLET | Freq: Four times a day (QID) | ORAL | Status: DC | PRN

## 2023-12-27 MED ORDER — LIDOCAINE HCL (PF) 1 % IJ SOLN: 30.0000 mL | INTRAMUSCULAR | Status: DC | PRN

## 2023-12-27 MED ORDER — MISOPROSTOL 25 MCG QUARTER TABLET: 25.0000 ug | ORAL_TABLET | Freq: Once | ORAL | Status: DC

## 2023-12-27 MED ORDER — LACTATED RINGERS IV SOLN: 500.0000 mL | INTRAVENOUS | Status: DC | PRN

## 2023-12-27 MED ORDER — FENTANYL-BUPIVACAINE-NACL 0.5-0.125-0.9 MG/250ML-% EP SOLN
12.0000 mL/h | EPIDURAL | Status: DC | PRN
  Administered 2023-12-28 – 2023-12-29 (×3): 12 mL/h via EPIDURAL
  Filled 2023-12-27 (×3): qty 250

## 2023-12-27 MED ORDER — FENTANYL CITRATE (PF) 100 MCG/2ML IJ SOLN
100.0000 ug | INTRAMUSCULAR | Status: DC | PRN
  Administered 2023-12-27 – 2023-12-28 (×4): 100 ug via INTRAVENOUS
  Filled 2023-12-27 (×4): qty 2

## 2023-12-27 MED ORDER — OXYCODONE-ACETAMINOPHEN 5-325 MG PO TABS: 1.0000 | ORAL_TABLET | ORAL | Status: DC | PRN

## 2023-12-27 MED ORDER — SOD CITRATE-CITRIC ACID 500-334 MG/5ML PO SOLN
30.0000 mL | ORAL | Status: DC | PRN
  Administered 2023-12-29: 30 mL via ORAL
  Filled 2023-12-27: qty 30

## 2023-12-27 MED ORDER — PHENYLEPHRINE 80 MCG/ML (10ML) SYRINGE FOR IV PUSH (FOR BLOOD PRESSURE SUPPORT): 80.0000 ug | PREFILLED_SYRINGE | INTRAVENOUS | Status: DC | PRN

## 2023-12-27 MED ORDER — FLEET ENEMA RE ENEM: 1.0000 | ENEMA | RECTAL | Status: DC | PRN

## 2023-12-27 MED ORDER — HYDRALAZINE HCL 20 MG/ML IJ SOLN: 10.0000 mg | INTRAMUSCULAR | Status: DC | PRN

## 2023-12-27 MED ORDER — LACTATED RINGERS IV SOLN: 500.0000 mL | Freq: Once | INTRAVENOUS | Status: DC

## 2023-12-27 MED ORDER — DIPHENHYDRAMINE HCL 50 MG/ML IJ SOLN: 12.5000 mg | INTRAMUSCULAR | Status: DC | PRN

## 2023-12-27 MED ORDER — EPHEDRINE 5 MG/ML INJ: 10.0000 mg | INTRAVENOUS | Status: DC | PRN

## 2023-12-27 MED ORDER — LABETALOL HCL 5 MG/ML IV SOLN: 80.0000 mg | INTRAVENOUS | Status: DC | PRN

## 2023-12-27 MED ORDER — OXYTOCIN BOLUS FROM INFUSION: 333.0000 mL | Freq: Once | INTRAVENOUS | Status: DC

## 2023-12-27 MED ORDER — PHENYLEPHRINE 80 MCG/ML (10ML) SYRINGE FOR IV PUSH (FOR BLOOD PRESSURE SUPPORT)
80.0000 ug | PREFILLED_SYRINGE | INTRAVENOUS | Status: AC | PRN
  Administered 2023-12-29 (×2): 160 ug via INTRAVENOUS

## 2023-12-27 MED ORDER — OXYCODONE-ACETAMINOPHEN 5-325 MG PO TABS: 2.0000 | ORAL_TABLET | ORAL | Status: DC | PRN

## 2023-12-27 MED ORDER — MISOPROSTOL 50MCG HALF TABLET
50.0000 ug | ORAL_TABLET | Freq: Once | ORAL | Status: AC
  Administered 2023-12-27: 50 ug via ORAL
  Filled 2023-12-27: qty 1

## 2023-12-27 MED ORDER — ONDANSETRON HCL 4 MG/2ML IJ SOLN
4.0000 mg | Freq: Four times a day (QID) | INTRAMUSCULAR | Status: DC | PRN
  Administered 2023-12-28 (×3): 4 mg via INTRAVENOUS
  Filled 2023-12-27 (×4): qty 2

## 2023-12-27 MED ORDER — OXYTOCIN-SODIUM CHLORIDE 30-0.9 UT/500ML-% IV SOLN
2.5000 [IU]/h | INTRAVENOUS | Status: DC
  Filled 2023-12-27 (×2): qty 500

## 2023-12-27 MED ORDER — LABETALOL HCL 5 MG/ML IV SOLN: 20.0000 mg | INTRAVENOUS | Status: DC | PRN

## 2023-12-27 MED ORDER — ACETAMINOPHEN 325 MG PO TABS: 650.0000 mg | ORAL_TABLET | ORAL | Status: DC | PRN

## 2023-12-27 MED ORDER — MISOPROSTOL 50MCG HALF TABLET: 50.0000 ug | ORAL_TABLET | Freq: Once | ORAL | Status: DC

## 2023-12-27 MED ORDER — TERBUTALINE SULFATE 1 MG/ML IJ SOLN: 0.2500 mg | Freq: Once | INTRAMUSCULAR | Status: DC | PRN

## 2023-12-27 MED ORDER — MISOPROSTOL 50MCG HALF TABLET
50.0000 ug | ORAL_TABLET | Freq: Once | ORAL | Status: AC
  Administered 2023-12-27: 50 ug via BUCCAL
  Filled 2023-12-27: qty 1

## 2023-12-27 NOTE — H&P (Signed)
 OBSTETRIC ADMISSION HISTORY AND PHYSICAL  Krystal [name] is a 23 y.o. female G1P0000 with IUP at [redacted]w[redacted]d by LMP presenting for IOL for GHTN. She reports +FMs, No LOF, no VB, no blurry vision, headaches or peripheral edema, and RUQ pain.  She plans on breast and formula feeding. She request POPs for birth control. She received her prenatal [facility] at [facility]   Dating: By LMP --->  Estimated Date of Delivery: 01/16/24  Sono:   @[redacted]w[redacted]d , CWD, normal female anatomy, cephalic presentation, posterior placental lie, 1987g, 55% EFW   Prenatal History/Complications:  - GHTN not on medication, asymptomatic, dx @ 36 weeks - MDD not on medication - Alpha thalassemia silent carrier - GERD - Anemia Hb 11.5 - Left Ventricular EIC   Past Medical History: Past Medical History:  Diagnosis Date   Adenotonsillar hypertrophy 11/19/2011   snores during sleep, stops breathing, and wakes up coughing/choking, per mother   Depression with anxiety    Pregnancy induced hypertension    Seasonal allergies     Past Surgical History: Past Surgical History:  Procedure Laterality Date   CLOSED REDUCTION FOREARM FRACTURE  09/28/2009   left distal radius/ulna   TONSILLECTOMY     TONSILLECTOMY AND ADENOIDECTOMY  11/27/2011   Procedure: TONSILLECTOMY AND ADENOIDECTOMY;  Surgeon: Ana LELON Moccasin, MD;  Location: [facility];  Service: ENT;  Laterality: N/A;   UMBILICAL HERNIA REPAIR  03/14/2007   UPPER GASTROINTESTINAL ENDOSCOPY  12/02/2003   after toxic ingestion    Obstetrical History: OB History     Gravida  1   Para  0   Term  0   Preterm  0   AB  0   Living  0      SAB  0   IAB  0   Ectopic  0   Multiple  0   Live Births  0           Social History Social History   Socioeconomic History   Marital status: Single    Spouse name: Not on file   Number of children: Not on file   Years of education: Not on file   Highest education level: Not on file  Occupational History    Not on file  Tobacco Use   Smoking status: Former    Types: E-cigarettes   Smokeless tobacco: Never   Tobacco comments:    inside smokers at home  Vaping Use   Vaping status: Some Days   Substances: Nicotine, Flavoring  Substance and Sexual Activity   Alcohol use: Not Currently   Drug use: Not Currently    Types: Marijuana    Comment: quit when found out preg   Sexual activity: Yes    Birth control/protection: None  Other Topics Concern   Not on file  Social History Narrative   Not on file   Social Drivers of Health   Financial Resource Strain: Low Risk  (07/04/2023)   Overall Financial Resource Strain (CARDIA)    Difficulty of Paying Living Expenses: Not hard at all  Food Insecurity: No Food Insecurity (12/27/2023)   Hunger Vital Sign    Worried About Running Out of Food in the Last Year: Never true    Ran Out of Food in the Last Year: Never true  Transportation Needs: No Transportation Needs (12/27/2023)   PRAPARE - Administrator, Civil Service (Medical): No    Lack of Transportation (Non-Medical): No  Physical Activity: Inactive (07/04/2023)  Exercise Vital Sign    Days of Exercise per Week: 0 days    Minutes of Exercise per Session: 0 min  Stress: Stress Concern Present (07/04/2023)   [employer] of Occupational Health - Occupational Stress Questionnaire    Feeling of Stress : Very much  Social Connections: Unknown (12/27/2023)   Social Connection and Isolation Panel    Frequency of Communication with Friends and Family: Never    Frequency of Social Gatherings with Friends and Family: Never    Attends Religious Services: Never    Diplomatic Services operational officer: Not on file    Attends Engineer, structural: Not on file    Marital Status: Not on file    Family History: Family History  Problem Relation Age of Onset   Healthy Mother    Sickle cell trait Father    Hypertension Maternal Grandmother    Diabetes Maternal Grandmother     Diabetes Maternal Grandfather     Allergies: No Known Allergies  Medications Prior to Admission  Medication Sig Dispense Refill Last Dose/Taking   acetaminophen  (TYLENOL ) 325 MG tablet Take 650 mg by mouth every 6 (six) hours as needed.      aspirin  EC 81 MG tablet Take 1 tablet (81 mg total) by mouth daily. Swallow whole. (Patient not taking: Reported on 12/26/2023) 90 tablet 3    cetirizine  (ZYRTEC ) 10 MG tablet Take 1 tablet (10 mg total) by mouth daily. 30 tablet 3    chlorhexidine  (PERIDEX ) 0.12 % solution Gargle and spit 15 mL twice daily for the next 10 days or until symptoms improve. (Patient not taking: Reported on 12/26/2023) 200 mL 0    cyclobenzaprine  (FLEXERIL ) 10 MG tablet Take 1 tablet (10 mg total) by mouth 3 (three) times daily as needed (headache). (Patient not taking: Reported on 12/26/2023) 15 tablet 0    famotidine  (PEPCID ) 20 MG tablet Take 1 tablet (20 mg total) by mouth at bedtime. 30 tablet 2    ferrous sulfate  325 (65 FE) MG tablet Take 1 tablet (325 mg total) by mouth daily with breakfast. 90 tablet 1    fluticasone  (FLONASE ) 50 MCG/ACT nasal spray Place 1 spray into both nostrils 2 (two) times daily. (Patient not taking: Reported on 12/26/2023) 16 g 2    metoCLOPramide  (REGLAN ) 10 MG tablet Take 1 tablet (10 mg total) by mouth every 6 (six) hours as needed for nausea. 30 tablet 0    ondansetron  (ZOFRAN ) 4 MG tablet Take 1 tablet (4 mg total) by mouth every 8 (eight) hours as needed. (Patient not taking: Reported on 12/26/2023) 20 tablet 1    Prenatal Vit-Fe Fumarate-FA (MULTIVITAMIN-PRENATAL) 27-0.8 MG TABS tablet Take 1 tablet by mouth daily at 12 noon.        Review of Systems   All systems reviewed and negative except as stated in HPI  Blood pressure 133/70, pulse (!) 112, temperature 98.1 F (36.7 C), temperature source Oral, resp. rate 18, height 5' 8 (1.727 m), weight 119.1 kg, last menstrual period 04/11/2023. General appearance: alert, cooperative, appears  stated age, and no distress Lungs: clear to auscultation bilaterally Heart: regular rate and rhythm Abdomen: soft, non-tender; bowel sounds normal Pelvic: adequate, unproven Extremities: Homans sign is negative, no sign of DVT DTR's 2+ Presentation: cephalic Fetal monitoringBaseline: 130 bpm, Variability: Good {> 6 bpm), Accelerations: Reactive, and Decelerations: Absent Uterine activityFrequency: Every 3-4 minutes   Dilation: 1 Effacement (%): Thick Cervical Position: Posterior Station: Ballotable Presentation: Vertex Exam by:: dr  Ivar Domangue   Prenatal labs: ABO, Rh: --/--/PENDING (08/08 1444) Antibody: PENDING (08/08 1444) Rubella: 6.08 (02/13 1553) RPR: Non Reactive (06/05 0815)  HBsAg: Negative (02/13 1553)  HIV: Non Reactive (06/05 0815)  GBS: Negative/-- (07/31 1631)    Lab Results  Component Value Date   GBS Negative 12/19/2023   GTT wnl Genetic screening  SILENT CARRIER for Alpha-Thalassemia (aa/a-) , otherwise negative, low risk Anatomy US  LVEIC   Immunization History  Administered Date(s) Administered   DTaP 11/18/2002, 02/02/2003, 04/06/2003, 02/18/2006, 01/30/2007   H1N1 06/21/2008   HIB (PRP-OMP) 11/18/2002, 02/02/2003, 04/06/2003, 09/10/2003   Hepatitis B 09/21/2002, 04/06/2003, 08/10/2003   IPV 11/18/2002, 02/02/2003, 09/10/2003, 01/30/2007   Influenza Nasal 04/18/2006   Influenza Whole 05/03/2005, 06/21/2008   MMR 09/10/2003, 01/30/2007   Pneumococcal Conjugate-13 11/18/2002, 02/02/2003, 04/06/2003, 02/18/2006   Tdap 11/07/2023   Varicella 02/18/2006, 01/30/2007    Prenatal Transfer Tool  Maternal Diabetes: No Genetic Screening: Normal Maternal Ultrasounds/Referrals: Isolated EIF (echogenic intracardiac focus) Fetal Ultrasounds or other Referrals:  None Maternal Substance Abuse:  No Significant Maternal Medications:  None Significant Maternal Lab Results: Group B Strep negative Number of Prenatal Visits:greater than 3 verified prenatal  visits Maternal Vaccinations:TDap Other Comments:  None   Results for orders placed or performed during the hospital encounter of 12/27/23 (from the past 24 hours)  Type and screen   Collection Time: 12/27/23  2:44 PM  Result Value Ref Range   ABO/RH(D) PENDING    Antibody Screen PENDING    Sample Expiration      12/30/2023,2359 Performed at [facility], [address], [place], [place] [address]     Patient Active Problem List   Diagnosis Date Noted   Gestational hypertension 12/19/2023   Heartburn during pregnancy in third trimester 11/26/2023   Alpha thalassemia silent carrier 07/16/2023   Depression with anxiety 07/04/2023   Supervision of high risk pregnancy, antepartum 06/21/2023   MDD (major depressive disorder) 04/21/2021   Chlamydia 10/30/2017    Assessment/Plan:  ZANOBIA GRIEBEL is a 23 y.o. G1P0000 at [redacted]w[redacted]d here for IOL for GHTN  #Labor:Cytotec  > FB > AROM > Pitocin  per SVE #Pain: Per pt request #FWB: Cat I #GBS status:  negative #Feeding: Formula and EBM #Reproductive Life planning: Progesterone only pills #Circ:  not applicable  #GHTN Asymptomatic, not on medications, mild range, labs wnl  Mardy Shropshire, MD  12/27/2023, 3:05 PM

## 2023-12-27 NOTE — H&P (Signed)
 OBSTETRIC ADMISSION HISTORY AND PHYSICAL  EMIE SOMMERFELD is a 23 y.o. female G1P0000 with IUP at [redacted]w[redacted]d by LMP presenting for IOL for GHTN. She reports +FMs, No LOF, no VB, no blurry vision, headaches or peripheral edema, and RUQ pain.  She plans on EBM feeding. She request POPs for birth control. She received her prenatal care at [facility]   Dating: By LMP --->  Estimated Date of Delivery: 01/16/24  Sono:   @[redacted]w[redacted]d , CWD, normal female anatomy, cephalic presentation, posterior placental lie, 1987  grams, 55 % EFW   Prenatal History/Complications:  - GHTN not on medications - Chlamydia tx 10/30/17 POC neg - MDD not on medications - Alpha thalassemia silent carrier - GERD  Past Medical History: Past Medical History:  Diagnosis Date   Adenotonsillar hypertrophy 11/19/2011   snores during sleep, stops breathing, and wakes up coughing/choking, per mother   Depression with anxiety    Pregnancy induced hypertension    Seasonal allergies     Past Surgical History: Past Surgical History:  Procedure Laterality Date   CLOSED REDUCTION FOREARM FRACTURE  09/28/2009   left distal radius/ulna   TONSILLECTOMY     TONSILLECTOMY AND ADENOIDECTOMY  11/27/2011   Procedure: TONSILLECTOMY AND ADENOIDECTOMY;  Surgeon: Ana LELON Moccasin, MD;  Location: [facility];  Service: ENT;  Laterality: N/A;   UMBILICAL HERNIA REPAIR  03/14/2007   UPPER GASTROINTESTINAL ENDOSCOPY  12/02/2003   after toxic ingestion    Obstetrical History: OB History     Gravida  1   Para  0   Term  0   Preterm  0   AB  0   Living  0      SAB  0   IAB  0   Ectopic  0   Multiple  0   Live Births  0           Social History Social History   Socioeconomic History   Marital status: Single    Spouse name: Not on file   Number of children: Not on file   Years of education: Not on file   Highest education level: Not on file  Occupational History   Not on file  Tobacco Use   Smoking  status: Former    Types: E-cigarettes   Smokeless tobacco: Never   Tobacco comments:    inside smokers at home  Vaping Use   Vaping status: Some Days   Substances: Nicotine, Flavoring  Substance and Sexual Activity   Alcohol use: Not Currently   Drug use: Not Currently    Types: Marijuana    Comment: quit when found out preg   Sexual activity: Yes    Birth control/protection: None  Other Topics Concern   Not on file  Social History Narrative   Not on file   Social Drivers of Health   Financial Resource Strain: Low Risk  (07/04/2023)   Overall Financial Resource Strain (CARDIA)    Difficulty of Paying Living Expenses: Not hard at all  Food Insecurity: No Food Insecurity (12/27/2023)   Hunger Vital Sign    Worried About Running Out of Food in the Last Year: Never true    Ran Out of Food in the Last Year: Never true  Transportation Needs: No Transportation Needs (12/27/2023)   PRAPARE - Administrator, Civil Service (Medical): No    Lack of Transportation (Non-Medical): No  Physical Activity: Inactive (07/04/2023)   Exercise Vital Sign    Days  of Exercise per Week: 0 days    Minutes of Exercise per Session: 0 min  Stress: Stress Concern Present (07/04/2023)   [employer] of Occupational Health - Occupational Stress Questionnaire    Feeling of Stress : Very much  Social Connections: Unknown (12/27/2023)   Social Connection and Isolation Panel    Frequency of Communication with Friends and Family: Never    Frequency of Social Gatherings with Friends and Family: Never    Attends Religious Services: Never    Diplomatic Services operational officer: Not on file    Attends Engineer, structural: Not on file    Marital Status: Not on file    Family History: Family History  Problem Relation Age of Onset   Healthy Mother    Sickle cell trait Father    Hypertension Maternal Grandmother    Diabetes Maternal Grandmother    Diabetes Maternal Grandfather      Allergies: No Known Allergies  Medications Prior to Admission  Medication Sig Dispense Refill Last Dose/Taking   acetaminophen  (TYLENOL ) 325 MG tablet Take 650 mg by mouth every 6 (six) hours as needed.      aspirin  EC 81 MG tablet Take 1 tablet (81 mg total) by mouth daily. Swallow whole. (Patient not taking: Reported on 12/26/2023) 90 tablet 3    cetirizine  (ZYRTEC ) 10 MG tablet Take 1 tablet (10 mg total) by mouth daily. 30 tablet 3    chlorhexidine  (PERIDEX ) 0.12 % solution Gargle and spit 15 mL twice daily for the next 10 days or until symptoms improve. (Patient not taking: Reported on 12/26/2023) 200 mL 0    cyclobenzaprine  (FLEXERIL ) 10 MG tablet Take 1 tablet (10 mg total) by mouth 3 (three) times daily as needed (headache). (Patient not taking: Reported on 12/26/2023) 15 tablet 0    famotidine  (PEPCID ) 20 MG tablet Take 1 tablet (20 mg total) by mouth at bedtime. 30 tablet 2    ferrous sulfate  325 (65 FE) MG tablet Take 1 tablet (325 mg total) by mouth daily with breakfast. 90 tablet 1    fluticasone  (FLONASE ) 50 MCG/ACT nasal spray Place 1 spray into both nostrils 2 (two) times daily. (Patient not taking: Reported on 12/26/2023) 16 g 2    metoCLOPramide  (REGLAN ) 10 MG tablet Take 1 tablet (10 mg total) by mouth every 6 (six) hours as needed for nausea. 30 tablet 0    ondansetron  (ZOFRAN ) 4 MG tablet Take 1 tablet (4 mg total) by mouth every 8 (eight) hours as needed. (Patient not taking: Reported on 12/26/2023) 20 tablet 1    Prenatal Vit-Fe Fumarate-FA (MULTIVITAMIN-PRENATAL) 27-0.8 MG TABS tablet Take 1 tablet by mouth daily at 12 noon.        Review of Systems   All systems reviewed and negative except as stated in HPI  Blood pressure 134/79, pulse (!) 118, temperature 98.1 F (36.7 C), temperature source Oral, resp. rate 20, height 5' 8 (1.727 m), weight 119.1 kg, last menstrual period 04/11/2023. General appearance: alert, cooperative, appears stated age, and no distress Lungs:  clear to auscultation bilaterally Heart: regular rate and rhythm Abdomen: soft, non-tender; bowel sounds normal Pelvic: adequate, unproven Extremities: Homans sign is negative, no sign of DVT DTR's 2+ Presentation: cephalic Fetal monitoringBaseline: 145 bpm, Variability: Good {> 6 bpm), Accelerations: Reactive, and Decelerations: Absent Uterine activityFrequency: Every 3 minutes  Dilation: 1 Effacement (%): Thick Station: Ballotable Exam by:: dr loyola   Prenatal labs: ABO, Rh: --/--/A POS (08/08 1444) Antibody: NEG (  08/08 1444) Rubella: 6.08 (02/13 1553) RPR: Non Reactive (06/05 0815)  HBsAg: Negative (02/13 1553)  HIV: Non Reactive (06/05 0815)  GBS: Negative/-- (07/31 1631)    Lab Results  Component Value Date   GBS Negative 12/19/2023   GTT wnl Genetic screening  silent alpha thalassemia carrier, otherwise negative Anatomy US  normal  Immunization History  Administered Date(s) Administered   DTaP 11/18/2002, 02/02/2003, 04/06/2003, 02/18/2006, 01/30/2007   H1N1 06/21/2008   HIB (PRP-OMP) 11/18/2002, 02/02/2003, 04/06/2003, 09/10/2003   Hepatitis B 09/21/2002, 04/06/2003, 08/10/2003   IPV 11/18/2002, 02/02/2003, 09/10/2003, 01/30/2007   Influenza Nasal 04/18/2006   Influenza Whole 05/03/2005, 06/21/2008   MMR 09/10/2003, 01/30/2007   Pneumococcal Conjugate-13 11/18/2002, 02/02/2003, 04/06/2003, 02/18/2006   Tdap 11/07/2023   Varicella 02/18/2006, 01/30/2007    Prenatal Transfer Tool  Maternal Diabetes: No Genetic Screening: Normal Maternal Ultrasounds/Referrals: Normal Fetal Ultrasounds or other Referrals:  None Maternal Substance Abuse:  No Significant Maternal Medications:  None Significant Maternal Lab Results: Group B Strep negative Number of Prenatal Visits:greater than 3 verified prenatal visits Maternal Vaccinations:TDap Other Comments:  None   Results for orders placed or performed during the hospital encounter of 12/27/23 (from the past 24  hours)  Type and screen   Collection Time: 12/27/23  2:44 PM  Result Value Ref Range   ABO/RH(D) A POS    Antibody Screen NEG    Sample Expiration      12/30/2023,2359 Performed at [facility], [address], [place], [place] [address]   CBC   Collection Time: 12/27/23  2:45 PM  Result Value Ref Range   WBC 5.7 4.0 - 10.5 K/uL   RBC 4.48 3.87 - 5.11 MIL/uL   Hemoglobin 11.5 (L) 12.0 - 15.0 g/dL   HCT 64.0 (L) 63.9 - 53.9 %   MCV 80.1 80.0 - 100.0 fL   MCH 25.7 (L) 26.0 - 34.0 pg   MCHC 32.0 30.0 - 36.0 g/dL   RDW 84.5 88.4 - 84.4 %   Platelets 122 (L) 150 - 400 K/uL   nRBC 0.0 0.0 - 0.2 %  Comprehensive metabolic panel   Collection Time: 12/27/23  2:45 PM  Result Value Ref Range   Sodium 139 135 - 145 mmol/L   Potassium 3.6 3.5 - 5.1 mmol/L   Chloride 108 98 - 111 mmol/L   CO2 20 (L) 22 - 32 mmol/L   Glucose, Bld 85 70 - 99 mg/dL   BUN <5 (L) 6 - 20 mg/dL   Creatinine, Ser 9.46 0.44 - 1.00 mg/dL   Calcium 8.8 (L) 8.9 - 10.3 mg/dL   Total Protein 6.4 (L) 6.5 - 8.1 g/dL   Albumin 2.9 (L) 3.5 - 5.0 g/dL   AST 14 (L) 15 - 41 U/L   ALT 11 0 - 44 U/L   Alkaline Phosphatase 107 38 - 126 U/L   Total Bilirubin 0.5 0.0 - 1.2 mg/dL   GFR, Estimated >39 >39 mL/min   Anion gap 11 5 - 15  Protein / creatinine ratio, urine   Collection Time: 12/27/23  2:45 PM  Result Value Ref Range   Creatinine, Urine 52 mg/dL   Total Protein, Urine <6 mg/dL   Protein Creatinine Ratio        0.00 - 0.15 mg/mg[Cre]    Patient Active Problem List   Diagnosis Date Noted   Gestational hypertension 12/19/2023   Heartburn during pregnancy in third trimester 11/26/2023   Alpha thalassemia silent carrier 07/16/2023   Depression with anxiety  07/04/2023   Supervision of high risk pregnancy, antepartum 06/21/2023   MDD (major depressive disorder) 04/21/2021   Chlamydia 10/30/2017    Assessment/Plan:  KINLEY DOZIER is a 23 y.o. G1P0000 at [redacted]w[redacted]d here for IOL for GHTN  #Labor:Buccal  Cytotec  per pt preference. Reassess for FB in 4 hours.  #Pain: Per pt request #FWB: Cat I #GBS status:  negative #Feeding: Formula & EBM #Reproductive Life planning: Progesterone only pills #Circ:  not applicable  #GHTN Mild range BP, asymptomatic, not on medication - preeclampsia labs pending  Mardy Shropshire, MD  12/27/2023, 5:26 PM

## 2023-12-27 NOTE — Progress Notes (Signed)
 LABOR PROGRESS NOTE  Patient Name: Krystal [name], female   DOB: [birth year], 23 y.o.  MRN: [id number]  Patient feeling some cramping. Amenable to FB placement. Requested fentanyl  pre-procedure. Discussed R/B/A of FB placement, and verbal consent obtained. Placed Cook without difficulty and balloon filled with 60 cc of saline. Mom and babe tolerated well. If contractions space, will redose cytotec . Plan for AROM once balloon out. Cat I.  #gHTN: Bps normotensive to borderline. No s/sx of preE.  [name], MD

## 2023-12-28 ENCOUNTER — Inpatient Hospital Stay (HOSPITAL_COMMUNITY): Payer: MEDICAID | Admitting: Anesthesiology

## 2023-12-28 DIAGNOSIS — Z3A00 Weeks of gestation of pregnancy not specified: Secondary | ICD-10-CM | POA: Diagnosis not present

## 2023-12-28 LAB — CBC
HCT: 33.1 % — ABNORMAL LOW (ref 36.0–46.0)
HCT: 34.2 % — ABNORMAL LOW (ref 36.0–46.0)
Hemoglobin: 10.6 g/dL — ABNORMAL LOW (ref 12.0–15.0)
Hemoglobin: 11 g/dL — ABNORMAL LOW (ref 12.0–15.0)
MCH: 25.7 pg — ABNORMAL LOW (ref 26.0–34.0)
MCH: 25.7 pg — ABNORMAL LOW (ref 26.0–34.0)
MCHC: 32 g/dL (ref 30.0–36.0)
MCHC: 32.2 g/dL (ref 30.0–36.0)
MCV: 79.9 fL — ABNORMAL LOW (ref 80.0–100.0)
MCV: 80.1 fL (ref 80.0–100.0)
Platelets: 111 K/uL — ABNORMAL LOW (ref 150–400)
Platelets: 115 K/uL — ABNORMAL LOW (ref 150–400)
RBC: 4.13 MIL/uL (ref 3.87–5.11)
RBC: 4.28 MIL/uL (ref 3.87–5.11)
RDW: 15.3 % (ref 11.5–15.5)
RDW: 15.4 % (ref 11.5–15.5)
WBC: 7.2 K/uL (ref 4.0–10.5)
WBC: 8.5 K/uL (ref 4.0–10.5)
nRBC: 0 % (ref 0.0–0.2)
nRBC: 0 % (ref 0.0–0.2)

## 2023-12-28 LAB — COMPREHENSIVE METABOLIC PANEL WITH GFR
ALT: 9 U/L (ref 0–44)
AST: 15 U/L (ref 15–41)
Albumin: 2.6 g/dL — ABNORMAL LOW (ref 3.5–5.0)
Alkaline Phosphatase: 102 U/L (ref 38–126)
Anion gap: 10 (ref 5–15)
BUN: <5 mg/dL — ABNORMAL LOW (ref 6–20)
CO2: 23 mmol/L (ref 22–32)
Calcium: 8.7 mg/dL — ABNORMAL LOW (ref 8.9–10.3)
Chloride: 104 mmol/L (ref 98–111)
Creatinine, Ser: 0.62 mg/dL (ref 0.44–1.00)
GFR, Estimated: >60 mL/min (ref >60)
Glucose, Bld: 92 mg/dL (ref 70–99)
Potassium: 3.6 mmol/L (ref 3.5–5.1)
Sodium: 137 mmol/L (ref 135–145)
Total Bilirubin: 0.7 mg/dL (ref 0.0–1.2)
Total Protein: 5.7 g/dL — ABNORMAL LOW (ref 6.5–8.1)

## 2023-12-28 LAB — RPR: RPR Ser Ql: NONREACTIVE

## 2023-12-28 MED ORDER — FAMOTIDINE 20 MG PO TABS: 20.0000 mg | ORAL_TABLET | Freq: Every day | ORAL | Status: DC

## 2023-12-28 MED ORDER — TRANEXAMIC ACID-NACL 1000-0.7 MG/100ML-% IV SOLN
1000.0000 mg | INTRAVENOUS | Status: DC
  Filled 2023-12-28: qty 100

## 2023-12-28 MED ORDER — OXYTOCIN-SODIUM CHLORIDE 30-0.9 UT/500ML-% IV SOLN
1.0000 m[IU]/min | INTRAVENOUS | Status: DC
  Administered 2023-12-28: 2 m[IU]/min via INTRAVENOUS

## 2023-12-28 MED ORDER — TERBUTALINE SULFATE 1 MG/ML IJ SOLN: 0.2500 mg | Freq: Once | INTRAMUSCULAR | Status: DC | PRN

## 2023-12-28 MED ORDER — FAMOTIDINE 20 MG PO TABS
20.0000 mg | ORAL_TABLET | Freq: Once | ORAL | Status: AC
  Administered 2023-12-28: 20 mg via ORAL

## 2023-12-28 MED ORDER — FAMOTIDINE 20 MG PO TABS
20.0000 mg | ORAL_TABLET | Freq: Every day | ORAL | Status: DC
  Administered 2023-12-28 – 2024-01-01 (×8): 20 mg via ORAL
  Filled 2023-12-28 (×6): qty 1

## 2023-12-28 MED ORDER — LIDOCAINE HCL (PF) 1 % IJ SOLN
INTRAMUSCULAR | Status: DC | PRN
  Administered 2023-12-28: 10 mL via EPIDURAL

## 2023-12-28 NOTE — Progress Notes (Signed)
 Patient ID: Krystal [name], female   DOB: 03/23/2001, 23 y.o.   MRN: [id number]  Comfortable w epidural; in flying cowgirl position  BPs 134/76, 140/71, 153/102 FHR [id number] s, +accels, occ mi variables Ctx q 2-3 mins with Pit at 75mu/min; MVUs 160-180 Cx deferred (was 5/80-90/-3,-2 per Dr Loyola @ 1930)  IUP@37 .2wks gHTN- plts 111 IOL process  Discussion with patient and family regarding IOL process including the general lengthiness of a G1 IOL; they understand we are anticipating and working towards [name] getting into the active phase of labor; they are aware that we will attempt to keep the MVUs adequate and continue with various maternal position changes and that cervical exams will be minimal while we patiently wait; maternal/fetal status both reassuring; it ctx remain adequate and active labor isn't achieved by 0530, we may visit the topic of delivery by Cesarean.  [name] CNM 12/28/2023

## 2023-12-28 NOTE — Progress Notes (Signed)
 LABOR PROGRESS NOTE  Patient Name: Krystal [name], female   DOB: 2001/03/25, 23 y.o.  MRN: [id number]  Comfortable with epidural.   Blood pressure (!) 93/45, pulse (!) 108, temperature 98 F (36.7 C), temperature source Oral, resp. rate 18, height 5' 8 (1.727 m), weight 119.1 kg, last menstrual period 04/11/2023, SpO2 98%.  Dilation: 5 Effacement (%): 60 Cervical Position: Posterior Station: -2 Presentation: Vertex Exam by:: dr nicholaus  EFM: baseline 125, accels, no decels, moderate variability TOCO: q42min contractions  Continue to titrate pitocin . Reassess after 4-6 hours of regular contractions.   Mardy Shropshire, MD

## 2023-12-28 NOTE — Progress Notes (Signed)
 LABOR PROGRESS NOTE  Patient Name: Krystal [name], female   DOB: 06-05-2000, 23 y.o.  MRN: [id number]  Patient comfortable with epidural. Got a nap in. Amenable to check. Cervix 5/60/-2. R/B/A of AROM discussed with patient, and verbal consent obtained. Babe's head found to be well-applied. Obtained small clear fluid. Mom and babe tolerated well. Cat I. Continue to titrate pit PRN.  #gHTN: normotensive to mild range.  [name], MD

## 2023-12-28 NOTE — Anesthesia Preprocedure Evaluation (Signed)
 Anesthesia Evaluation  Patient identified by MRN, date of birth, ID band Patient awake    Reviewed: Allergy & Precautions, NPO status , Patient's Chart, lab work & pertinent test results  Airway Mallampati: III  TM Distance: >3 FB Neck ROM: Full    Dental no notable dental hx.    Pulmonary neg pulmonary ROS, former smoker   Pulmonary exam normal breath sounds clear to auscultation       Cardiovascular hypertension (gHTN), Normal cardiovascular exam Rhythm:Regular Rate:Normal     Neuro/Psych  PSYCHIATRIC DISORDERS Anxiety Depression    negative neurological ROS     GI/Hepatic negative GI ROS, Neg liver ROS,,,  Endo/Other    Class 3 obesity (BMI 40)  Renal/GU negative Renal ROS  negative genitourinary   Musculoskeletal negative musculoskeletal ROS (+)    Abdominal   Peds  Hematology negative hematology ROS (+)   Anesthesia Other Findings IOL for gHTN  Reproductive/Obstetrics (+) Pregnancy                              Anesthesia Physical Anesthesia Plan  ASA: 3  Anesthesia Plan: Epidural   Post-op Pain Management:    Induction:   PONV Risk Score and Plan: Treatment may vary due to age or medical condition  Airway Management Planned: Natural Airway  Additional Equipment:   Intra-op Plan:   Post-operative Plan:   Informed Consent: I have reviewed the patients History and Physical, chart, labs and discussed the procedure including the risks, benefits and alternatives for the proposed anesthesia with the patient or authorized representative who has indicated his/her understanding and acceptance.       Plan Discussed with: Anesthesiologist  Anesthesia Plan Comments: (Patient identified. Risks, benefits, options discussed with patient including but not limited to bleeding, infection, nerve damage, paralysis, failed block, incomplete pain control, headache, blood pressure  changes, nausea, vomiting, reactions to medication, itching, and post partum back pain. Confirmed with bedside nurse the patient's most recent platelet count. Confirmed with the patient that they are not taking any anticoagulation, have any bleeding history or any family history of bleeding disorders. Patient expressed understanding and wishes to proceed. All questions were answered. )        Anesthesia Quick Evaluation

## 2023-12-28 NOTE — Progress Notes (Signed)
 LABOR PROGRESS NOTE  Patient Name: Krystal [name], female   DOB: 04-Jun-2000, 23 y.o.  MRN: [id number]  FB out; epidural placed. Amenable to check. Cervix 5/50/ballotable. Will start pit 2x2 and AROM once applied. Cat I.  #gHTN: normotensive to mild range pressures. Denies symptoms of preE. Notably platelets are downtrending (122>115). If become <100, would rule-in for preE w/SF and would need magnesium. Recheck in 12 hours.  [name], MD

## 2023-12-28 NOTE — Anesthesia Procedure Notes (Signed)
 Epidural Patient location during procedure: OB Start time: 12/28/2023 1:50 AM End time: 12/28/2023 2:00 AM  Staffing Anesthesiologist: Niels Marien CROME, MD Performed: anesthesiologist   Preanesthetic Checklist Completed: patient identified, IV checked, risks and benefits discussed, monitors and equipment checked, pre-op evaluation and timeout performed  Epidural Patient position: sitting Prep: DuraPrep and site prepped and draped Patient monitoring: continuous pulse ox, blood pressure, heart rate and cardiac monitor Approach: midline Location: L3-L4 Injection technique: LOR air  Needle:  Needle type: Tuohy  Needle gauge: 17 G Needle length: 9 cm Needle insertion depth: 8 cm Catheter type: closed end flexible Catheter size: 19 Gauge Catheter at skin depth: 14 cm Test dose: negative  Assessment Sensory level: T8 Events: blood not aspirated, no cerebrospinal fluid, injection not painful, no injection resistance, no paresthesia and negative IV test  Additional Notes Patient identified. Risks/Benefits/Options discussed with patient including but not limited to bleeding, infection, nerve damage, paralysis, failed block, incomplete pain control, headache, blood pressure changes, nausea, vomiting, reactions to medication both or allergic, itching and postpartum back pain. Confirmed with bedside nurse the patient's most recent platelet count. Confirmed with patient that they are not currently taking any anticoagulation, have any bleeding history or any family history of bleeding disorders. Patient expressed understanding and wished to proceed. All questions were answered. Sterile technique was used throughout the entire procedure. Please see nursing notes for vital signs. Test dose was given through epidural catheter and negative prior to continuing to dose epidural or start infusion. Warning signs of high block given to the patient including shortness of breath, tingling/numbness in hands,  complete motor block, or any concerning symptoms with instructions to call for help. Patient was given instructions on fall risk and not to get out of bed. All questions and concerns addressed with instructions to call with any issues or inadequate analgesia.  Reason for block:procedure for pain

## 2023-12-29 ENCOUNTER — Encounter (HOSPITAL_COMMUNITY): Payer: Self-pay | Admitting: Obstetrics & Gynecology

## 2023-12-29 ENCOUNTER — Other Ambulatory Visit: Payer: Self-pay

## 2023-12-29 ENCOUNTER — Encounter (HOSPITAL_COMMUNITY)
Admission: RE | Disposition: A | Discharge status: Home / Self Care | Payer: Self-pay | Source: Home / Self Care | Attending: Obstetrics & Gynecology

## 2023-12-29 DIAGNOSIS — Z3A00 Weeks of gestation of pregnancy not specified: Secondary | ICD-10-CM

## 2023-12-29 DIAGNOSIS — O99344 Other mental disorders complicating childbirth: Secondary | ICD-10-CM

## 2023-12-29 DIAGNOSIS — O411230 Chorioamnionitis, third trimester, not applicable or unspecified: Secondary | ICD-10-CM

## 2023-12-29 DIAGNOSIS — O134 Gestational [pregnancy-induced] hypertension without significant proteinuria, complicating childbirth: Secondary | ICD-10-CM

## 2023-12-29 DIAGNOSIS — Z98891 History of uterine scar from previous surgery: Secondary | ICD-10-CM

## 2023-12-29 DIAGNOSIS — O98919 Unspecified maternal infectious and parasitic disease complicating pregnancy, unspecified trimester: Secondary | ICD-10-CM | POA: Diagnosis not present

## 2023-12-29 DIAGNOSIS — Z3A37 37 weeks gestation of pregnancy: Secondary | ICD-10-CM

## 2023-12-29 LAB — CBC
HCT: 34 % — ABNORMAL LOW (ref 36.0–46.0)
HCT: 32.9 % — ABNORMAL LOW (ref 36.0–46.0)
Hemoglobin: 11.1 g/dL — ABNORMAL LOW (ref 12.0–15.0)
Hemoglobin: 10.6 g/dL — ABNORMAL LOW (ref 12.0–15.0)
MCH: 26.3 pg (ref 26.0–34.0)
MCH: 25.5 pg — ABNORMAL LOW (ref 26.0–34.0)
MCHC: 32.6 g/dL (ref 30.0–36.0)
MCHC: 32.2 g/dL (ref 30.0–36.0)
MCV: 80.6 fL (ref 80.0–100.0)
MCV: 79.3 fL — ABNORMAL LOW (ref 80.0–100.0)
Platelets: 115 K/uL — ABNORMAL LOW (ref 150–400)
Platelets: 117 K/uL — ABNORMAL LOW (ref 150–400)
RBC: 4.22 MIL/uL (ref 3.87–5.11)
RBC: 4.15 MIL/uL (ref 3.87–5.11)
RDW: 15.2 % (ref 11.5–15.5)
RDW: 15.3 % (ref 11.5–15.5)
WBC: 10.9 K/uL — ABNORMAL HIGH (ref 4.0–10.5)
WBC: 12.3 K/uL — ABNORMAL HIGH (ref 4.0–10.5)
nRBC: 0 % (ref 0.0–0.2)
nRBC: 0 % (ref 0.0–0.2)

## 2023-12-29 LAB — CREATININE, SERUM
Creatinine, Ser: 0.67 mg/dL (ref 0.44–1.00)
GFR, Estimated: >60 mL/min (ref >60)

## 2023-12-29 SURGERY — Surgical Case
Anesthesia: Epidural

## 2023-12-29 MED ORDER — SODIUM BICARBONATE 8.4 % IV SOLN
INTRAVENOUS | Status: AC
  Filled 2023-12-29: qty 50

## 2023-12-29 MED ORDER — WITCH HAZEL-GLYCERIN EX PADS: 1.0000 | MEDICATED_PAD | CUTANEOUS | Status: DC | PRN

## 2023-12-29 MED ORDER — NALOXONE HCL 4 MG/10ML IJ SOLN: 1.0000 ug/kg/h | INTRAVENOUS | Status: DC | PRN

## 2023-12-29 MED ORDER — SENNOSIDES-DOCUSATE SODIUM 8.6-50 MG PO TABS
2.0000 | ORAL_TABLET | Freq: Every day | ORAL | Status: DC
  Administered 2023-12-30 – 2024-01-01 (×6): 2 via ORAL
  Filled 2023-12-29 (×3): qty 2

## 2023-12-29 MED ORDER — MORPHINE SULFATE (PF) 0.5 MG/ML IJ SOLN
INTRAMUSCULAR | Status: DC | PRN
  Administered 2023-12-29: 2 mg via INTRAVENOUS

## 2023-12-29 MED ORDER — MEPERIDINE HCL 25 MG/ML IJ SOLN: 6.2500 mg | INTRAMUSCULAR | Status: DC | PRN

## 2023-12-29 MED ORDER — HYDRALAZINE HCL 20 MG/ML IJ SOLN
5.0000 mg | INTRAMUSCULAR | Status: DC | PRN
Start: 2023-12-29 — End: 2024-01-01

## 2023-12-29 MED ORDER — CEFAZOLIN SODIUM-DEXTROSE 2-4 GM/100ML-% IV SOLN
2.0000 g | Freq: Three times a day (TID) | INTRAVENOUS | Status: DC
  Administered 2023-12-29 – 2023-12-30 (×3): 2 g via INTRAVENOUS
  Filled 2023-12-29 (×4): qty 100

## 2023-12-29 MED ORDER — SOD CITRATE-CITRIC ACID 500-334 MG/5ML PO SOLN: 30.0000 mL | ORAL | Status: DC

## 2023-12-29 MED ORDER — ACETAMINOPHEN 10 MG/ML IV SOLN
INTRAVENOUS | Status: DC | PRN
  Administered 2023-12-29: 1000 mg via INTRAVENOUS

## 2023-12-29 MED ORDER — KETOROLAC TROMETHAMINE 30 MG/ML IJ SOLN
30.0000 mg | Freq: Four times a day (QID) | INTRAMUSCULAR | Status: DC | PRN
  Administered 2023-12-29: 30 mg via INTRAVENOUS

## 2023-12-29 MED ORDER — DIBUCAINE (PERIANAL) 1 % EX OINT: 1.0000 | TOPICAL_OINTMENT | CUTANEOUS | Status: DC | PRN

## 2023-12-29 MED ORDER — KETOROLAC TROMETHAMINE 30 MG/ML IJ SOLN: 30.0000 mg | Freq: Once | INTRAMUSCULAR | Status: DC | PRN

## 2023-12-29 MED ORDER — LIDOCAINE-EPINEPHRINE (PF) 2 %-1:200000 IJ SOLN
INTRAMUSCULAR | Status: DC | PRN
Start: 2023-12-29 — End: 2023-12-29
  Administered 2023-12-29: 3 mL via EPIDURAL
  Administered 2023-12-29: 10 mL via EPIDURAL

## 2023-12-29 MED ORDER — HYDROMORPHONE HCL 1 MG/ML IJ SOLN: 0.2500 mg | INTRAMUSCULAR | Status: DC | PRN

## 2023-12-29 MED ORDER — KETOROLAC TROMETHAMINE 30 MG/ML IJ SOLN
INTRAMUSCULAR | Status: AC
  Filled 2023-12-29: qty 1

## 2023-12-29 MED ORDER — FUROSEMIDE 20 MG PO TABS
20.0000 mg | ORAL_TABLET | Freq: Every day | ORAL | Status: DC
  Administered 2023-12-29 – 2024-01-01 (×7): 20 mg via ORAL
  Filled 2023-12-29 (×4): qty 1

## 2023-12-29 MED ORDER — DIPHENHYDRAMINE HCL 25 MG PO CAPS: 25.0000 mg | ORAL_CAPSULE | ORAL | Status: DC | PRN

## 2023-12-29 MED ORDER — STERILE WATER FOR IRRIGATION IR SOLN
Status: DC | PRN
  Administered 2023-12-29: 1000 mL

## 2023-12-29 MED ORDER — POTASSIUM CHLORIDE CRYS ER 20 MEQ PO TBCR
20.0000 meq | EXTENDED_RELEASE_TABLET | Freq: Every day | ORAL | Status: DC
  Administered 2023-12-29 – 2024-01-01 (×7): 20 meq via ORAL
  Filled 2023-12-29 (×4): qty 1

## 2023-12-29 MED ORDER — ACETAMINOPHEN 500 MG PO TABS
1000.0000 mg | ORAL_TABLET | Freq: Four times a day (QID) | ORAL | Status: AC
  Administered 2023-12-29 – 2023-12-30 (×4): 1000 mg via ORAL
  Filled 2023-12-29 (×4): qty 2

## 2023-12-29 MED ORDER — POVIDONE-IODINE 10 % EX SWAB: 2.0000 | Freq: Once | CUTANEOUS | Status: DC

## 2023-12-29 MED ORDER — ONDANSETRON HCL 4 MG/2ML IJ SOLN
INTRAMUSCULAR | Status: AC
  Filled 2023-12-29: qty 2

## 2023-12-29 MED ORDER — CEFAZOLIN SODIUM-DEXTROSE 2-4 GM/100ML-% IV SOLN
2.0000 g | INTRAVENOUS | Status: AC
  Administered 2023-12-29: 2 g via INTRAVENOUS

## 2023-12-29 MED ORDER — NALOXONE HCL 0.4 MG/ML IJ SOLN: 0.4000 mg | INTRAMUSCULAR | Status: DC | PRN

## 2023-12-29 MED ORDER — TRANEXAMIC ACID-NACL 1000-0.7 MG/100ML-% IV SOLN: 1000.0000 mg | Freq: Once | INTRAVENOUS | Status: DC

## 2023-12-29 MED ORDER — ENOXAPARIN SODIUM 60 MG/0.6ML IJ SOSY
60.0000 mg | PREFILLED_SYRINGE | INTRAMUSCULAR | Status: DC
  Administered 2023-12-30 – 2024-01-01 (×6): 60 mg via SUBCUTANEOUS
  Filled 2023-12-29 (×3): qty 0.6

## 2023-12-29 MED ORDER — DEXAMETHASONE SODIUM PHOSPHATE 10 MG/ML IJ SOLN
INTRAMUSCULAR | Status: DC | PRN
  Administered 2023-12-29: 5 mg via INTRAVENOUS

## 2023-12-29 MED ORDER — SODIUM CHLORIDE 0.9 % IR SOLN
Status: DC | PRN
  Administered 2023-12-29: 1

## 2023-12-29 MED ORDER — GABAPENTIN 100 MG PO CAPS
200.0000 mg | ORAL_CAPSULE | Freq: Three times a day (TID) | ORAL | Status: DC
  Administered 2023-12-29 – 2024-01-01 (×16): 200 mg via ORAL
  Filled 2023-12-29 (×9): qty 2

## 2023-12-29 MED ORDER — TRANEXAMIC ACID-NACL 1000-0.7 MG/100ML-% IV SOLN
INTRAVENOUS | Status: DC | PRN
  Administered 2023-12-29: 1000 mg via INTRAVENOUS

## 2023-12-29 MED ORDER — ONDANSETRON HCL 4 MG/2ML IJ SOLN
INTRAMUSCULAR | Status: DC | PRN
  Administered 2023-12-29: 4 mg via INTRAVENOUS

## 2023-12-29 MED ORDER — OXYTOCIN-SODIUM CHLORIDE 30-0.9 UT/500ML-% IV SOLN
INTRAVENOUS | Status: DC | PRN
  Administered 2023-12-29: 300 mL via INTRAVENOUS

## 2023-12-29 MED ORDER — SIMETHICONE 80 MG PO CHEW: 80.0000 mg | CHEWABLE_TABLET | ORAL | Status: DC | PRN

## 2023-12-29 MED ORDER — HYDRALAZINE HCL 20 MG/ML IJ SOLN: 10.0000 mg | INTRAMUSCULAR | Status: DC | PRN

## 2023-12-29 MED ORDER — DIPHENHYDRAMINE HCL 25 MG PO CAPS: 25.0000 mg | ORAL_CAPSULE | Freq: Four times a day (QID) | ORAL | Status: DC | PRN

## 2023-12-29 MED ORDER — IBUPROFEN 600 MG PO TABS
600.0000 mg | ORAL_TABLET | Freq: Four times a day (QID) | ORAL | Status: AC
  Administered 2023-12-29 – 2024-01-01 (×20): 600 mg via ORAL
  Filled 2023-12-29 (×12): qty 1

## 2023-12-29 MED ORDER — DEXMEDETOMIDINE HCL IN NACL 200 MCG/50ML IV SOLN
INTRAVENOUS | Status: DC | PRN
  Administered 2023-12-29 (×4): 8 ug via INTRAVENOUS

## 2023-12-29 MED ORDER — FENTANYL CITRATE (PF) 100 MCG/2ML IJ SOLN
INTRAMUSCULAR | Status: DC | PRN
  Administered 2023-12-29: 100 ug via EPIDURAL

## 2023-12-29 MED ORDER — CALCIUM CARBONATE ANTACID 500 MG PO CHEW
400.0000 mg | CHEWABLE_TABLET | Freq: Once | ORAL | Status: AC
  Administered 2023-12-29: 400 mg via ORAL
  Filled 2023-12-29: qty 2

## 2023-12-29 MED ORDER — METOCLOPRAMIDE HCL 5 MG/ML IJ SOLN
10.0000 mg | Freq: Four times a day (QID) | INTRAMUSCULAR | Status: DC | PRN
  Administered 2023-12-29 (×2): 10 mg via INTRAVENOUS
  Filled 2023-12-29 (×2): qty 2

## 2023-12-29 MED ORDER — LACTATED RINGERS IV SOLN: INTRAVENOUS | Status: DC

## 2023-12-29 MED ORDER — SCOPOLAMINE 1 MG/3DAYS TD PT72
1.0000 | MEDICATED_PATCH | Freq: Once | TRANSDERMAL | Status: AC
  Administered 2023-12-29: 1.5 mg via TRANSDERMAL

## 2023-12-29 MED ORDER — NIFEDIPINE ER OSMOTIC RELEASE 30 MG PO TB24
30.0000 mg | ORAL_TABLET | Freq: Every day | ORAL | Status: DC
  Administered 2023-12-29 – 2024-01-01 (×7): 30 mg via ORAL
  Filled 2023-12-29 (×4): qty 1

## 2023-12-29 MED ORDER — LABETALOL HCL 5 MG/ML IV SOLN: 20.0000 mg | INTRAVENOUS | Status: DC | PRN

## 2023-12-29 MED ORDER — MORPHINE SULFATE (PF) 0.5 MG/ML IJ SOLN
INTRAMUSCULAR | Status: AC
  Filled 2023-12-29: qty 10

## 2023-12-29 MED ORDER — PRENATAL MULTIVITAMIN CH
1.0000 | ORAL_TABLET | Freq: Every day | ORAL | Status: DC
  Administered 2023-12-30 – 2024-01-01 (×6): 1 via ORAL
  Filled 2023-12-29 (×4): qty 1

## 2023-12-29 MED ORDER — SODIUM CHLORIDE 0.9 % IV SOLN
500.0000 mg | INTRAVENOUS | Status: AC
  Administered 2023-12-29: 500 mg via INTRAVENOUS

## 2023-12-29 MED ORDER — SODIUM CHLORIDE 0.9% FLUSH
3.0000 mL | INTRAVENOUS | Status: DC | PRN
Start: 2023-12-29 — End: 2024-01-01

## 2023-12-29 MED ORDER — KETOROLAC TROMETHAMINE 30 MG/ML IJ SOLN: 30.0000 mg | Freq: Four times a day (QID) | INTRAMUSCULAR | Status: DC | PRN

## 2023-12-29 MED ORDER — FENTANYL CITRATE (PF) 100 MCG/2ML IJ SOLN
INTRAMUSCULAR | Status: AC
  Filled 2023-12-29: qty 2

## 2023-12-29 MED ORDER — LABETALOL HCL 5 MG/ML IV SOLN
40.0000 mg | INTRAVENOUS | Status: DC | PRN
Start: 2023-12-29 — End: 2024-01-01

## 2023-12-29 MED ORDER — SIMETHICONE 80 MG PO CHEW
80.0000 mg | CHEWABLE_TABLET | Freq: Three times a day (TID) | ORAL | Status: DC
  Administered 2023-12-29 – 2024-01-01 (×17): 80 mg via ORAL
  Filled 2023-12-29 (×10): qty 1

## 2023-12-29 MED ORDER — AMISULPRIDE (ANTIEMETIC) 5 MG/2ML IV SOLN: 10.0000 mg | Freq: Once | INTRAVENOUS | Status: DC | PRN

## 2023-12-29 MED ORDER — OXYTOCIN-SODIUM CHLORIDE 30-0.9 UT/500ML-% IV SOLN
1.0000 m[IU]/min | INTRAVENOUS | Status: DC
  Administered 2023-12-29: 4 m[IU]/min via INTRAVENOUS

## 2023-12-29 MED ORDER — COCONUT OIL OIL: 1.0000 | TOPICAL_OIL | Status: DC | PRN

## 2023-12-29 MED ORDER — LIDOCAINE-EPINEPHRINE (PF) 2 %-1:200000 IJ SOLN
INTRAMUSCULAR | Status: AC
  Filled 2023-12-29: qty 20

## 2023-12-29 MED ORDER — ZOLPIDEM TARTRATE 5 MG PO TABS: 5.0000 mg | ORAL_TABLET | Freq: Every evening | ORAL | Status: DC | PRN

## 2023-12-29 MED ORDER — OXYCODONE HCL 5 MG/5ML PO SOLN: 5.0000 mg | Freq: Once | ORAL | Status: DC | PRN

## 2023-12-29 MED ORDER — DIPHENHYDRAMINE HCL 50 MG/ML IJ SOLN: 12.5000 mg | INTRAMUSCULAR | Status: DC | PRN

## 2023-12-29 MED ORDER — OXYTOCIN-SODIUM CHLORIDE 30-0.9 UT/500ML-% IV SOLN: 2.5000 [IU]/h | INTRAVENOUS | Status: AC

## 2023-12-29 MED ORDER — MORPHINE SULFATE (PF) 0.5 MG/ML IJ SOLN
INTRAMUSCULAR | Status: DC | PRN
  Administered 2023-12-29: 3 ug via INTRATHECAL

## 2023-12-29 MED ORDER — ONDANSETRON HCL 4 MG/2ML IJ SOLN
4.0000 mg | Freq: Three times a day (TID) | INTRAMUSCULAR | Status: DC | PRN
Start: 2023-12-29 — End: 2023-12-31

## 2023-12-29 MED ORDER — SCOPOLAMINE 1 MG/3DAYS TD PT72
MEDICATED_PATCH | TRANSDERMAL | Status: AC
  Filled 2023-12-29: qty 1

## 2023-12-29 MED ORDER — OXYCODONE HCL 5 MG PO TABS: 5.0000 mg | ORAL_TABLET | Freq: Once | ORAL | Status: DC | PRN

## 2023-12-29 MED ORDER — ONDANSETRON HCL 4 MG/2ML IJ SOLN: 4.0000 mg | Freq: Once | INTRAMUSCULAR | Status: DC | PRN

## 2023-12-29 MED ORDER — MENTHOL 3 MG MT LOZG: 1.0000 | LOZENGE | OROMUCOSAL | Status: DC | PRN

## 2023-12-29 MED ORDER — TERBUTALINE SULFATE 1 MG/ML IJ SOLN: 0.2500 mg | Freq: Once | INTRAMUSCULAR | Status: DC | PRN

## 2023-12-29 SURGICAL SUPPLY — 32 items
BARRIER ADHS 3X4 INTERCEED (GAUZE/BANDAGES/DRESSINGS) IMPLANT
CHLORAPREP W/TINT 26 (MISCELLANEOUS) ×2 IMPLANT
CLAMP UMBILICAL CORD (MISCELLANEOUS) ×1 IMPLANT
CLOTH BEACON ORANGE TIMEOUT ST (SAFETY) ×1 IMPLANT
DERMABOND ADVANCED .7 DNX12 (GAUZE/BANDAGES/DRESSINGS) IMPLANT
DRSG OPSITE POSTOP 4X10 (GAUZE/BANDAGES/DRESSINGS) ×1 IMPLANT
ELECTRODE REM PT RTRN 9FT ADLT (ELECTROSURGICAL) ×1 IMPLANT
EXTRACTOR VACUUM KIWI (MISCELLANEOUS) IMPLANT
GAUZE PAD ABD 7.5X8 STRL (GAUZE/BANDAGES/DRESSINGS) IMPLANT
GAUZE SPONGE 4X4 12PLY STRL LF (GAUZE/BANDAGES/DRESSINGS) IMPLANT
GLOVE BIO SURGEON STRL SZ7 (GLOVE) ×1 IMPLANT
GLOVE BIOGEL PI IND STRL 7.0 (GLOVE) ×2 IMPLANT
GOWN STRL REUS W/TWL LRG LVL3 (GOWN DISPOSABLE) ×2 IMPLANT
KIT ABG SYR 3ML LUER SLIP (SYRINGE) IMPLANT
MAT PREVALON FULL STRYKER (MISCELLANEOUS) IMPLANT
NDL HYPO 25X5/8 SAFETYGLIDE (NEEDLE) IMPLANT
NEEDLE HYPO 22GX1.5 SAFETY (NEEDLE) IMPLANT
NEEDLE HYPO 25X5/8 SAFETYGLIDE (NEEDLE) IMPLANT
NS IRRIG 1000ML POUR BTL (IV SOLUTION) ×1 IMPLANT
PACK C SECTION WH (CUSTOM PROCEDURE TRAY) ×1 IMPLANT
PAD OB MATERNITY 4.3X12.25 (PERSONAL CARE ITEMS) ×1 IMPLANT
RETRACTOR WND ALEXIS 25 LRG (MISCELLANEOUS) IMPLANT
SUT VIC AB 0 CT1 36 (SUTURE) ×6 IMPLANT
SUT VIC AB 0 CTX36XBRD ANBCTRL (SUTURE) ×2 IMPLANT
SUT VIC AB 2-0 CT1 TAPERPNT 27 (SUTURE) ×1 IMPLANT
SUT VIC AB 4-0 PS2 27 (SUTURE) ×1 IMPLANT
SUTURE PLAIN GUT 2.0 ETHICON (SUTURE) IMPLANT
SYR CONTROL 10ML LL (SYRINGE) IMPLANT
TAPE CLOTH SURG 4X10 WHT LF (GAUZE/BANDAGES/DRESSINGS) IMPLANT
TOWEL OR 17X24 6PK STRL BLUE (TOWEL DISPOSABLE) ×1 IMPLANT
TRAY FOLEY W/BAG SLVR 14FR LF (SET/KITS/TRAYS/PACK) IMPLANT
WATER STERILE IRR 1000ML POUR (IV SOLUTION) ×1 IMPLANT

## 2023-12-29 NOTE — Progress Notes (Signed)
 Patient ID: Krystal [name], female   DOB: 02-19-2001, 23 y.o.   MRN: [id number]  VS: 145/84, 118, 18, 99.9  Dilation: 5 Effacement (%): 90 Cervical Position: Middle Station: -1 Presentation: Vertex Exam by:: Lima, rn   Pt resting with epidural, feeling unwell (very nauseated despite reglan ), notes nausea worse with contractions. On 62mu/min of Pit (iwht IUPC in place), now also febrile with chills/hot flashes. Cervix unchanged (has been 5cm since yesterday am). Cat 2 tracing (baby now tachycardic with minimal reactivity.) Pt expressed frustration with the process and readiness to proceed with a Cesarean. Will call Dr. Eveline for CS consent and turn over care.  Cornell Finder, CNM, MSN, IBCLC Certified Nurse Midwife, [facility]

## 2023-12-29 NOTE — Transfer of Care (Signed)
 Immediate Anesthesia Transfer of Care Note  Patient: Krystal [name]  Procedure(s) Performed: CESAREAN DELIVERY  Patient Location: PACU  Anesthesia Type:Epidural  Level of Consciousness: awake  Airway & Oxygen Therapy: Patient Spontanous Breathing  Post-op Assessment: Report given to RN  Post vital signs: Reviewed and stable  Last Vitals:  Vitals Value Taken Time  BP    Temp    Pulse 112 12/29/23 11:44  Resp 23 12/29/23 11:44  SpO2 95 % 12/29/23 11:44  Vitals shown include unfiled device data.  Last Pain:  Vitals:   12/29/23 1008  TempSrc: Oral  PainSc:          Complications: No notable events documented.

## 2023-12-29 NOTE — Plan of Care (Signed)
 Problem: Education: Goal: Knowledge of General Education information will improve Description: Including pain rating scale, medication(s)/side effects and non-pharmacologic comfort measures Outcome: Progressing   Problem: Health Behavior/Discharge Planning: Goal: Ability to manage health-related needs will improve Outcome: Progressing   Problem: Clinical Measurements: Goal: Ability to maintain clinical measurements within normal limits will improve Outcome: Progressing Goal: Will remain free from infection Outcome: Progressing Goal: Diagnostic test results will improve Outcome: Progressing Goal: Respiratory complications will improve Outcome: Progressing Goal: Cardiovascular complication will be avoided Outcome: Progressing   Problem: Activity: Goal: Risk for activity intolerance will decrease Outcome: Progressing   Problem: Nutrition: Goal: Adequate nutrition will be maintained Outcome: Progressing   Problem: Coping: Goal: Level of anxiety will decrease Outcome: Progressing   Problem: Elimination: Goal: Will not experience complications related to bowel motility Outcome: Progressing Goal: Will not experience complications related to urinary retention Outcome: Progressing   Problem: Pain Managment: Goal: General experience of comfort will improve and/or be controlled Outcome: Progressing   Problem: Safety: Goal: Ability to remain free from injury will improve Outcome: Progressing   Problem: Skin Integrity: Goal: Risk for impaired skin integrity will decrease Outcome: Progressing   Problem: Education: Goal: Knowledge of disease or condition will improve Outcome: Progressing Goal: Knowledge of the prescribed therapeutic regimen will improve Outcome: Progressing   Problem: Fluid Volume: Goal: Peripheral tissue perfusion will improve Outcome: Progressing   Problem: Clinical Measurements: Goal: Complications related to disease process, condition or treatment  will be avoided or minimized Outcome: Progressing   Problem: Education: Goal: Knowledge of Childbirth will improve Outcome: Progressing Goal: Ability to make informed decisions regarding treatment and plan of care will improve Outcome: Progressing Goal: Ability to state and carry out methods to decrease the pain will improve Outcome: Progressing Goal: Individualized Educational Video(s) Outcome: Progressing   Problem: Coping: Goal: Ability to verbalize concerns and feelings about labor and delivery will improve Outcome: Progressing   Problem: Life Cycle: Goal: Ability to make normal progression through stages of labor will improve Outcome: Progressing Goal: Ability to effectively push during vaginal delivery will improve Outcome: Progressing   Problem: Role Relationship: Goal: Will demonstrate positive interactions with the child Outcome: Progressing   Problem: Safety: Goal: Risk of complications during labor and delivery will decrease Outcome: Progressing   Problem: Pain Management: Goal: Relief or control of pain from uterine contractions will improve Outcome: Progressing   Problem: Education: Goal: Knowledge of the prescribed therapeutic regimen will improve Outcome: Progressing Goal: Understanding of sexual limitations or changes related to disease process or condition will improve Outcome: Progressing Goal: Individualized Educational Video(s) Outcome: Progressing   Problem: Self-Concept: Goal: Communication of feelings regarding changes in body function or appearance will improve Outcome: Progressing   Problem: Skin Integrity: Goal: Demonstration of wound healing without infection will improve Outcome: Progressing   Problem: Education: Goal: Knowledge of condition will improve Outcome: Progressing Goal: Individualized Educational Video(s) Outcome: Progressing Goal: Individualized Newborn Educational Video(s) Outcome: Progressing   Problem:  Activity: Goal: Will verbalize the importance of balancing activity with adequate rest periods Outcome: Progressing Goal: Ability to tolerate increased activity will improve Outcome: Progressing   Problem: Coping: Goal: Ability to identify and utilize available resources and services will improve Outcome: Progressing   Problem: Life Cycle: Goal: Chance of risk for complications during the postpartum period will decrease Outcome: Progressing   Problem: Role Relationship: Goal: Ability to demonstrate positive interaction with newborn will improve Outcome: Progressing   Problem: Skin Integrity: Goal: Demonstration of wound healing without infection  will improve Outcome: Progressing

## 2023-12-29 NOTE — Progress Notes (Signed)
  Patient has not progressed and is candidate for cesarean section and she accepts.The risks of surgery were discussed with the patient including but were not limited to: bleeding which may require transfusion or reoperation; infection which may require antibiotics; injury to bowel, bladder, ureters or other surrounding organs; injury to the fetus; need for additional procedures including hysterectomy in the event of a life-threatening hemorrhage; formation of adhesions; placental abnormalities with subsequent pregnancies; incisional problems; thromboembolic phenomenon and other postoperative/anesthesia complications.  The patient concurred with the proposed plan, giving informed written consent for the procedure.   Patient has been NPO since 0000 she will remain NPO for procedure. Anesthesia and OR aware. Preoperative prophylactic antibiotics and SCDs ordered on call to the OR.  To OR when ready. Patient ID: Krystal [name], female   DOB: [birth year], 23 y.o.   MRN: [id number]

## 2023-12-29 NOTE — Anesthesia Postprocedure Evaluation (Signed)
 Anesthesia Post Note  Patient: Krystal [name]  Procedure(s) Performed: CESAREAN DELIVERY     Patient location during evaluation: PACU Anesthesia Type: Epidural Level of consciousness: awake and alert and oriented Pain management: pain level controlled Vital Signs Assessment: post-procedure vital signs reviewed and stable Respiratory status: spontaneous breathing, nonlabored ventilation and respiratory function stable Cardiovascular status: blood pressure returned to baseline and stable Postop Assessment: no headache, no backache, epidural receding and no apparent nausea or vomiting Anesthetic complications: no   No notable events documented.  Last Vitals:  Vitals:   12/29/23 1145 12/29/23 1200  BP: 133/69 (!) 125/95  Pulse: (!) 117 (!) 105  Resp: (!) 24 (!) 23  Temp: 36.9 C   SpO2: 95% 96%    Last Pain:  Vitals:   12/29/23 1200  TempSrc:   PainSc: 3                  Almarie CHRISTELLA Marchi

## 2023-12-29 NOTE — Progress Notes (Signed)
 Patient ID: Krystal [name], female   DOB: 2000-09-07, 23 y.o.   MRN: [id number]  Comfortable w epidural  BPs 149/92, 140/84, T 99.4 FHR 140s, +accels, no decels Ctx q 2-3 mins, more wavelike pattern now; Pit at 60mu/min Cx 5/90/vtx -2 (cx not swollen; vtx doesn't feel asynclitic)  IUP@37 .3wks gHTN IOL process  Discussion with family regarding a Pitocin  break as it has been close to 22 hours since Pit was started; we would leave it off x 4h and then restart at a high dose protocol; they are agreeable with this plan. Plan to restart at 0430.  [name] CNM 12/29/2023 12:47 AM

## 2023-12-29 NOTE — Discharge Summary (Incomplete)
 Postpartum Discharge Summary  Date of Service updated-8/13     Patient Name: Krystal [name] DOB: July 02, 2000 MRN: [id number]  Date of admission: 12/27/2023 Delivery date:12/29/2023 Delivering provider: EVELINE LYNWOOD MATSU Date of discharge: 01/01/2024  Admitting diagnosis: Gestational hypertension [O13.9] Intrauterine pregnancy: [redacted]w[redacted]d     Secondary diagnosis:  Principal Problem:   Gestational hypertension Active Problems:   MDD (major depressive disorder)   Depression with anxiety   Alpha thalassemia silent carrier   Heartburn during pregnancy in third trimester   S/P primary low transverse C-section   Intrauterine infection  Additional problems: none    Discharge diagnosis: Term Pregnancy Delivered and Gestational Hypertension                                              Post partum procedures:none Augmentation: AROM, Pitocin , Cytotec , and IP Foley Complications: Intrauterine Inflammation or infection (Chorioamniotis)  Hospital course: Induction of Labor With Cesarean Section   23 y.o. yo G1P1001 at [redacted]w[redacted]d was admitted to the hospital 12/27/2023 for induction of labor. Patient had a labor course significant for failure of induction. The patient went for cesarean section due to Arrest of Dilation. Delivery details are as follows: Membrane Rupture Time/Date: 7:28 AM,12/28/2023  Delivery Method:C-Section, Vacuum Assisted Operative Delivery:Device used:Kiwi Indication: Fetal indications Details of operation can be found in separate operative Note.  Patient had a postpartum course complicated by gestHTN- she was treated with Lasix  and Procardia .  Additionally, due to suspected chorioamnionitis, she received IV antibiotics x 24hr.   She is ambulating, tolerating a regular diet, passing flatus, and urinating well.  Patient is discharged home in stable condition on 01/01/24.      Newborn Data: Birth date:12/29/2023 Birth time:10:51 AM Gender:Female Living status:Living Apgars:8 ,9   Weight:3250 g                               Magnesium Sulfate received: No BMZ received: No Rhophylac:No MMR:No T-DaP:offered postpartum Flu: N/A RSV Vaccine received: No Transfusion:No  Immunizations received: Immunization History  Administered Date(s) Administered   DTaP 11/18/2002, 02/02/2003, 04/06/2003, 02/18/2006, 01/30/2007   H1N1 06/21/2008   HIB (PRP-OMP) 11/18/2002, 02/02/2003, 04/06/2003, 09/10/2003   Hepatitis B 09/21/2002, 04/06/2003, 08/10/2003   IPV 11/18/2002, 02/02/2003, 09/10/2003, 01/30/2007   Influenza Nasal 04/18/2006   Influenza Whole 05/03/2005, 06/21/2008   MMR 09/10/2003, 01/30/2007   Pneumococcal Conjugate-13 11/18/2002, 02/02/2003, 04/06/2003, 02/18/2006   Tdap 11/07/2023   Varicella 02/18/2006, 01/30/2007    Physical exam  Vitals:   12/31/23 0507 12/31/23 1308 12/31/23 2309 01/01/24 0606  BP: 131/82 133/79 134/88 139/83  Pulse: (!) 102 96 100 (!) 101  Resp: 18 17 18 16   Temp: 98.1 F (36.7 C) 98.2 F (36.8 C) 98.2 F (36.8 C) 98.5 F (36.9 C)  TempSrc: Oral Oral Oral Oral  SpO2: 99% 98% 99% 99%  Weight:      Height:       General: alert, cooperative, and no distress Lochia: appropriate Uterine Fundus: firm Incision: Dressing is clean, dry, and intact DVT Evaluation: No evidence of DVT seen on physical exam. Labs: Lab Results  Component Value Date   WBC 9.4 12/30/2023   HGB 9.4 (L) 12/30/2023   HCT 29.1 (L) 12/30/2023   MCV 79.5 (L) 12/30/2023   PLT 104 (L) 12/30/2023  Latest Ref Rng & Units 12/29/2023    2:12 PM  CMP  Creatinine 0.44 - 1.00 mg/dL 9.32    Edinburgh Score:    12/29/2023    4:16 PM  Edinburgh Postnatal Depression Scale Screening Tool  I have been able to laugh and see the funny side of things. 0  I have looked forward with enjoyment to things. 0  I have blamed myself unnecessarily when things went wrong. 0  I have been anxious or worried for no good reason. 1  I have felt scared or panicky for no  good reason. 1  Things have been getting on top of me. 0  I have been so unhappy that I have had difficulty sleeping. 1  I have felt sad or miserable. 1  I have been so unhappy that I have been crying. 1  The thought of harming myself has occurred to me. 0  Edinburgh Postnatal Depression Scale Total 5   No data recorded  After visit meds:  Allergies as of 01/01/2024   No Known Allergies      Medication List     TAKE these medications    acetaminophen  325 MG tablet Commonly known as: Tylenol  Take 2 tablets (650 mg total) by mouth every 6 (six) hours as needed.   ferrous gluconate  324 MG tablet Commonly known as: FERGON Take 1 tablet (324 mg total) by mouth every other day.   furosemide  20 MG tablet Commonly known as: LASIX  Take 1 tablet (20 mg total) by mouth daily for 4 days.   ibuprofen  600 MG tablet Commonly known as: ADVIL  Take 1 tablet (600 mg total) by mouth every 6 (six) hours.   NIFEdipine  30 MG 24 hr tablet Commonly known as: ADALAT  CC Take 1 tablet (30 mg total) by mouth daily.   norethindrone  0.35 MG tablet Commonly known as: MICRONOR  Take 1 tablet (0.35 mg total) by mouth daily.   oxyCODONE  5 MG immediate release tablet Commonly known as: Oxy IR/ROXICODONE  Take 1 tablet (5 mg total) by mouth every 6 (six) hours as needed for up to 7 days for severe pain (pain score 7-10) or breakthrough pain.   potassium chloride  SA 20 MEQ tablet Commonly known as: KLOR-CON  M Take 1 tablet (20 mEq total) by mouth daily.   senna-docusate 8.6-50 MG tablet Commonly known as: Senokot-S Take 2 tablets by mouth daily for 20 days. For constipation         Discharge home in stable condition Infant Feeding: Bottle and EBM Infant Disposition:home with mother Discharge instruction: per After Visit Summary and Postpartum booklet. Activity: Advance as tolerated. Pelvic rest for 6 weeks.  Diet: routine diet Future Appointments: Future Appointments  Date Time Provider  Department Center  01/08/2024 11:10 AM Loreli Suzen BIRCH, CNM [facility]  02/10/2024  2:50 PM Delores Nidia CROME, FNP [facility]   Follow up Visit:  Follow-up Information     [facility]. Schedule an appointment as soon as possible for a visit in 1 week(s).   Specialty: Obstetrics and Gynecology Why: Postpartum incision check up in 1 week Contact information: [address] [place]  [address] [phone]        [facility] for [facility] at [facility]. Schedule an appointment as soon as possible for a visit in 4 week(s).   Specialty: Obstetrics and Gynecology Why: Routine postpartum follow up in 4-6 weeks Contact information: [address] [employer]  [place] [address] [phone]                 Please schedule this patient for a In person postpartum visit in 4 weeks with the following provider: Any provider. Additional Postpartum F/U:Incision check 1 week and BP check 1 week  High risk pregnancy complicated by: HTN Delivery mode:  C-Section, Vacuum Assisted Anticipated Birth Control:  POPs   01/01/2024 Jennifer M Ozan, DO

## 2023-12-29 NOTE — Op Note (Signed)
 Krystal [name] PROCEDURE DATE: 12/29/2023  PREOPERATIVE DIAGNOSES: Intrauterine pregnancy at [redacted]w[redacted]d weeks gestation; failed induction, gestational hypertension  POSTOPERATIVE DIAGNOSES: The same  PROCEDURE: Primary Low Transverse Cesarean Section  SURGEON:  Dr. Lynwood Solomons  ASSISTANT:  Dr. Mardy Shropshire An experienced assistant was required given the standard of surgical care given the complexity of the case.  This assistant was needed for exposure, dissection, suctioning, retraction, instrument exchange, and for overall help during the procedure.   ANESTHESIOLOGY TEAM: Anesthesiologist: Merla Almarie HERO, DO; Darlyn Rush, MD; Niels Marien LITTIE, MD CRNA: Jeanenne Rock LABOR, CRNA  INDICATIONS: Krystal [name] is a 23 y.o. G1P1001 at [redacted]w[redacted]d here for cesarean section secondary to the indications listed under preoperative diagnoses; please see preoperative note for further details.  The risks of surgery were discussed with the patient including but were not limited to: bleeding which may require transfusion or reoperation; infection which may require antibiotics; injury to bowel, bladder, ureters or other surrounding organs; injury to the fetus; need for additional procedures including hysterectomy in the event of a life-threatening hemorrhage; formation of adhesions; placental abnormalities wth subsequent pregnancies; incisional problems; thromboembolic phenomenon and other postoperative/anesthesia complications.  The patient concurred with the proposed plan, giving informed written consent for the procedure.    FINDINGS:  Viable female infant in cephalic presentation.  Apgars 8 and 9.  Amniotic fluid: meconium.  Intact placenta, three vessel cord.  Normal uterus, fallopian tubes and ovaries bilaterally.  ANESTHESIA: epidural INTRAVENOUS FLUIDS: 1000 ml   ESTIMATED BLOOD LOSS: 748 ml URINE OUTPUT:  1000 ml SPECIMENS: Placenta sent to L&D  COMPLICATIONS: None immediate  PROCEDURE IN  DETAIL:  The patient preoperatively received intravenous antibiotics and had sequential compression devices applied to her lower extremities.  She was then taken to the operating room where the epidural was dosed up to a surgical level and found to be adequate. She was then placed in a dorsal supine position with a leftward tilt, and prepped and draped in a sterile manner.  A foley catheter was  was already in place.  After an adequate timeout was performed, a Pfannenstiel skin incision was made with scalpel and carried through to the underlying layer of fascia. The fascia was incised in the midline, and this incision was extended bluntly. The rectus muscles were separated in the midline and the peritoneum was entered bluntly.   The Alexis self-retaining retractor was introduced into the abdominal cavity.  Attention was turned to the lower uterine segment where a low transverse hysterotomy was made with a scalpel and extended bilaterally bluntly.  The infant was successfully delivered, the cord was clamped and cut after one minute, and the infant was handed over to the awaiting neonatology team. Uterine massage was then administered, and the placenta delivered intact with a three-vessel cord. The uterus was then cleared of clots and debris.  The hysterotomy was closed with 0 Vicryl in a running fashion. Figure-of-eight 0 Vicryl serosal stitches were placed to help with hemostasis.    The pelvis was cleared of all clot and debris. Hemostasis was confirmed on all surfaces.  The retractor was removed.  The peritoneum was closed with a 2-0 Vicryl running stitch. The fascia was then closed using 0 Vicryl in a running fashion.  The subcutaneous layer was irrigated, and was found to be hemostatic, any areas of bleeding were cauterized with the bovie, and was reapproximated with 2-0 plain gut in a running fashion. The skin was closed with a 4-0 monocryl subcuticular  stitch. The patient tolerated the procedure well.  Sponge, instrument and needle counts were correct x 3.  She was taken to the recovery room in stable condition.   Mardy Shropshire, MD FMOB Fellow, Faculty practice [facility], Center for [facility]

## 2023-12-30 LAB — CBC
HCT: 29.1 % — ABNORMAL LOW (ref 36.0–46.0)
Hemoglobin: 9.4 g/dL — ABNORMAL LOW (ref 12.0–15.0)
MCH: 25.7 pg — ABNORMAL LOW (ref 26.0–34.0)
MCHC: 32.3 g/dL (ref 30.0–36.0)
MCV: 79.5 fL — ABNORMAL LOW (ref 80.0–100.0)
Platelets: 104 K/uL — ABNORMAL LOW (ref 150–400)
RBC: 3.66 MIL/uL — ABNORMAL LOW (ref 3.87–5.11)
RDW: 15.3 % (ref 11.5–15.5)
WBC: 9.4 K/uL (ref 4.0–10.5)
nRBC: 0 % (ref 0.0–0.2)

## 2023-12-30 MED ORDER — ACETAMINOPHEN 500 MG PO TABS
1000.0000 mg | ORAL_TABLET | Freq: Four times a day (QID) | ORAL | Status: DC
  Administered 2023-12-30 – 2024-01-01 (×16): 1000 mg via ORAL
  Filled 2023-12-30 (×8): qty 2

## 2023-12-30 MED ORDER — SODIUM CHLORIDE 0.9 % IV SOLN
3.0000 g | Freq: Four times a day (QID) | INTRAVENOUS | Status: AC
  Administered 2023-12-30 (×4): 3 g via INTRAVENOUS
  Filled 2023-12-30 (×2): qty 8

## 2023-12-30 MED ORDER — SODIUM CHLORIDE 0.9 % IV SOLN: INTRAVENOUS | Status: AC | PRN

## 2023-12-30 MED ORDER — OXYCODONE HCL 5 MG PO TABS
5.0000 mg | ORAL_TABLET | ORAL | Status: DC | PRN
  Administered 2023-12-30 (×8): 5 mg via ORAL
  Filled 2023-12-30 (×4): qty 1

## 2023-12-30 MED ORDER — FERROUS GLUCONATE 324 (38 FE) MG PO TABS
324.0000 mg | ORAL_TABLET | ORAL | Status: DC
  Administered 2023-12-30 – 2024-01-01 (×4): 324 mg via ORAL
  Filled 2023-12-30 (×2): qty 1

## 2023-12-30 NOTE — Progress Notes (Signed)
 Pt c/o pain 7-8/10 with ambulation. Only has orders for tylenol  and ibuprofen . Dr. Janita called and will place medication order.

## 2023-12-30 NOTE — Progress Notes (Signed)
 POSTPARTUM PROGRESS NOTE  Subjective: Krystal [name] is a 23 y.o. G1P1001 s/p pLTCS at [redacted]w[redacted]d.  She reports she is doing well. No acute events overnight. She denies any problems with ambulating, voiding or po intake. Denies nausea or vomiting. She has passed flatus. Pain is well controlled.  Lochia is moderate.  Objective: Blood pressure 132/73, pulse (!) 107, temperature 98.6 F (37 C), temperature source Oral, resp. rate 20, height 5' 8 (1.727 m), weight 119.1 kg, last menstrual period 04/11/2023, SpO2 100%, unknown if currently breastfeeding.  Physical Exam:  General: alert, cooperative and no distress Chest: no respiratory distress Abdomen: soft, non-tender  Uterine Fundus: firm and at level of umbilicus Extremities: No calf swelling or tenderness  Trace edema  Recent Labs    12/29/23 1412 12/30/23 0442  HGB 10.6* 9.4*  HCT 32.9* 29.1*    Assessment/Plan: Krystal [name] is a 23 y.o. G1P1001 s/p pLTCS at [redacted]w[redacted]d for AoDil, gHTN.  Routine Postpartum Care: Doing well, pain well-controlled.  -- Continue routine care, lactation support  -- Contraception: POPs -- Feeding: breast  -- gHTN: No s/sx of preE. Lasix /K x5 days PP. Bps improving on Procardia  XL 30 mg every day -- ABLA: Clinically significant. Start PO iron   Dispo: Plan for discharge 8/12-8/13.  Krystal Almarie HERO, MD OB Fellow 12/30/2023 5:26 AM

## 2023-12-31 ENCOUNTER — Encounter (HOSPITAL_COMMUNITY): Payer: Self-pay | Admitting: Obstetrics & Gynecology

## 2023-12-31 LAB — SURGICAL PATHOLOGY

## 2023-12-31 MED ORDER — ONDANSETRON 4 MG PO TBDP: 4.0000 mg | ORAL_TABLET | Freq: Three times a day (TID) | ORAL | Status: DC | PRN

## 2023-12-31 NOTE — Clinical Social Work Maternal (Signed)
 CLINICAL SOCIAL WORK MATERNAL/CHILD NOTE  Patient Details  Name: Krystal [name] MRN: [id number] Date of Birth: 01/12/2001  Date:  12/31/2023  Clinical Social Worker Initiating Note:  Rosina Molt Date/Time: Initiated:  12/31/23/1412     Child's Name:  Krystal [name]   Biological Parents:  Mother, Father Krystal [name] Dec 28, 2000 did not want to name FOB)   Need for Interpreter:  None   Reason for Referral:  Behavioral Health Concerns   Address:  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   Education:  High school graduate   Homebound arranged:    Surveyor, quantity Resources:  Medicaid   Other Resources:  [facility]   Cultural/Religious Considerations Which May Impact Care:    Strengths:  Ability to meet basic needs  , Home prepared for child  , Understanding of illness, Pediatrician chosen   Psychotropic Medications:         Pediatrician:    [place]  Pediatrician List:   [employer]    [place]    [facility]  [facility]      Pediatrician Fax Number:    Risk Factors/Current Problems:  Mental Health Concerns     Cognitive State:  Able to Concentrate  , Alert  , Linear Thinking  , Insightful  , Goal Oriented     Mood/Affect:  Comfortable  , Calm  , Interested  , Relaxed     CSW Assessment: CSW received a consult for a hx of anxiety and depression. CSW met MOB at bedside to complete a full  psychosocial assessment and offer support. CSW entered there room, introduced herself and acknowledged that her mom was present. MOB gave CSW verbal permission to speak about anything while her mom was present. CSW explained her role and the reason for the visit. MOB presented as calm, was agreeable to consult and remained engaged throughout encounter.  CSW collected MOB's demographic information and inquired about mental health history. MOB reported being diagnosed with anxiety, MDD and schizophrenia in 2022. MOB reported being hospitalized 2022 due to auditory and visual hallucinations. MOB reported her hospital stay was helpful and she was able to begin 3 different medications for support. MOB reported participating in therapy during her hospital stay with a just a few follow up visits once she was discharged. MOB declined any/all mental health support services currently and reported coping skills which included staying busy, occupying her mind and working. CSW asked MOB would a therapy list be helpful during this PP period; MOB declined. CSW provided education regarding the baby blues period vs. perinatal mood disorders, discussed treatment and gave resources for mental health follow up if  concerns arise.  CSW recommends self-evaluation during the postpartum time period using the New Mom Checklist from Postpartum Progress and encouraged MOB to contact a medical professional if symptoms are noted at any time. CSW assessed for safety with MOB SI/HI/DV;MOB denied all.   CSW asked MOB does she receive support resources; MOB said yes(WIC). MOB reported having all essential items for the infant including a carseat, bassinet and crib for safe sleeping. CSW provided review of Sudden Infant Death Syndrome (SIDS) precautions.    CSW Plan/Description:  CSW identifies no further need for intervention and no barriers to discharge at this time.    Rosina MARLA Molt, LCSW 12/31/2023, 2:17 PM

## 2023-12-31 NOTE — Lactation Note (Signed)
 This note was copied from a baby's chart. Lactation Consultation Note  Patient Name: Krystal [name] Date: 12/31/2023 Age:21 hours  The LC O/P from the med center asked for this mom to be set up with a DEBP.  The mother didn't have a LC order to see Lactation, so this LC asked the MBU nurse caring for the dyad  to ask the mother if she desired for lactation to see her and the [facility] order would be put in .  Per the [facility] nurse Kristen asked the mom and she declined the [facility] visit and to be set up with a DEBP.    Maternal Data    Feeding Nipple Type: Nfant Slow Flow (purple)    [name] [facility] 12/31/2023, 7:13 PM

## 2023-12-31 NOTE — Progress Notes (Signed)
 Honeycomb dressing changed per provider order.  Incision approximated with no signs or symptoms of infection. Reviewed dressing care with patient, acknowledged instructions.

## 2023-12-31 NOTE — Patient Instructions (Signed)
 If interested in an outpatient lactation consult in office or virtually please reach out to us  at [facility] (First Floor) [address], [place] Please call [phone] and press [phone] for lactation.   -- Lactation support groups:  [facility], Tuesdays 10:00 am -12:00 pm at [address] on the second floor in the conference room, lactating parents and lap babies welcome.  [email or web address]  [email or web address]    Krystal [name], [facility] for [facility]

## 2023-12-31 NOTE — Progress Notes (Signed)
 POSTPARTUM PROGRESS NOTE  Post op Day 2 Subjective:  Krystal [name] is a 23 y.o. G1P1001 [redacted]w[redacted]d s/p pltcs.  No acute events overnight.  Pt denies problems with ambulating, voiding or po intake.  She denies nausea or vomiting.  Pain is well controlled.  She has had flatus. She has had bowel movement.  Lochia Small.   Objective: Blood pressure 131/82, pulse (!) 102, temperature 98.1 F (36.7 C), temperature source Oral, resp. rate 18, height 5' 8 (1.727 m), weight 119.1 kg, last menstrual period 04/11/2023, SpO2 99%, unknown if currently breastfeeding.  Physical Exam:  General: alert, cooperative and no distress Lochia:normal flow Chest: CTAB Heart: RRR no m/r/g Abdomen: +BS, soft, nontender,  Uterine Fundus: firm, dressing c/d/i DVT Evaluation: No calf swelling or tenderness Extremities: trace LE edema  Recent Labs    12/29/23 1412 12/30/23 0442  HGB 10.6* 9.4*  HCT 32.9* 29.1*    Assessment/Plan:  ASSESSMENT: Krystal [name] is a 23 y.o. G1P1001 [redacted]w[redacted]d s/p pltcs, doing well. Hgb and lochia appropriate. Ghtn well controlled on lasix /procardia . Breastfeeding. Planning on POPs. Baby staying today so will patient  Plan for discharge tomorrow   LOS: 4 days   Devaughn KATHEE Ban 12/31/2023, 10:47 AM

## 2024-01-01 ENCOUNTER — Other Ambulatory Visit (HOSPITAL_COMMUNITY): Payer: Self-pay

## 2024-01-01 MED ORDER — OXYCODONE HCL 5 MG PO TABS
5.0000 mg | ORAL_TABLET | Freq: Four times a day (QID) | ORAL | 0 refills | Status: AC | PRN
  Filled 2024-01-01: qty 26, 7d supply, fill #0

## 2024-01-01 MED ORDER — NORETHINDRONE 0.35 MG PO TABS
1.0000 | ORAL_TABLET | Freq: Every day | ORAL | 4 refills | Status: DC
  Filled 2024-01-01: qty 84, 84d supply, fill #0

## 2024-01-01 MED ORDER — IBUPROFEN 600 MG PO TABS
600.0000 mg | ORAL_TABLET | Freq: Four times a day (QID) | ORAL | 0 refills | Status: AC
  Filled 2024-01-01: qty 30, 8d supply, fill #0

## 2024-01-01 MED ORDER — NIFEDIPINE ER 30 MG PO TB24
30.0000 mg | ORAL_TABLET | Freq: Every day | ORAL | 0 refills | Status: AC
  Filled 2024-01-01: qty 30, 30d supply, fill #0

## 2024-01-01 MED ORDER — FERROUS GLUCONATE 324 (38 FE) MG PO TABS
324.0000 mg | ORAL_TABLET | ORAL | 0 refills | Status: AC
  Filled 2024-01-01: qty 30, 60d supply, fill #0

## 2024-01-01 MED ORDER — POTASSIUM CHLORIDE CRYS ER 20 MEQ PO TBCR
20.0000 meq | EXTENDED_RELEASE_TABLET | Freq: Every day | ORAL | 0 refills | Status: DC
  Filled 2024-01-01: qty 4, 4d supply, fill #0

## 2024-01-01 MED ORDER — SENNOSIDES-DOCUSATE SODIUM 8.6-50 MG PO TABS
2.0000 | ORAL_TABLET | Freq: Every day | ORAL | 0 refills | Status: AC
  Filled 2024-01-01: qty 40, 20d supply, fill #0

## 2024-01-01 MED ORDER — FUROSEMIDE 20 MG PO TABS
20.0000 mg | ORAL_TABLET | Freq: Every day | ORAL | 0 refills | Status: DC
  Filled 2024-01-01 (×2): qty 4, 4d supply, fill #0

## 2024-01-01 MED ORDER — ACETAMINOPHEN 325 MG PO TABS: 650.0000 mg | ORAL_TABLET | Freq: Four times a day (QID) | ORAL | Status: AC | PRN

## 2024-01-01 NOTE — Lactation Note (Signed)
 This note was copied from a baby's chart. Lactation Consultation Note  Patient Name: Krystal [name] Unijb'd Date: 01/01/2024 Age:21 hours  Reason for consult: Initial assessment;Primapara;1st time breastfeeding;Exclusive pumping and bottle feeding  P1, [redacted]w[redacted]d, 4.3% weight loss  Mother decided prior to delivery that she only wanted to formula feed. Over the course of her stay, her breast became full and breast volume increased. Mother requested to pump using a manual pump and expressed milk. Mother was bottle feeding her expressed milk very proudly upon arrival to room and reports she had just expressed 60 ml. Mother does not want to latch baby but desires to exclusively pump and bottle feed.  Attempts were made to get mother a stork pump via her insurance but her insurance was out of network. Mother is removing milk with ease using her hand pump. Information provided of other DME companies that may provide a personal electrical pump.   Discussed management of engorgement, signs and symptom of mastitis and if occurs to report to patient's OB provider. Discussed Lactogenesis II/ supply and demand for maintaining milk supply. Informed to pump every 3 hrs ( 8 times per day) to establish and maintain her milk supply. Instructed mother on the use, frequency, cleaning of breast pump and storage of breast milk. Handouts provided.   Mom made aware of O/P services, breastfeeding support groups, community resources, and our phone # for post-discharge questions.    Maternal Data Has patient been taught Hand Expression?: Yes Does the patient have breastfeeding experience prior to this delivery?: No  Feeding Mother's Current Feeding Choice: Breast Milk and Formula Nipple Type: Nfant Slow Flow (purple)   Interventions Interventions: Education;LC Services brochure;CDC milk storage guidelines;CDC Guidelines for Breast Pump Cleaning;Pace feeding;Breast feeding basics reviewed  Discharge Discharge  Education: Engorgement and breast care;Warning signs for feeding baby;Outpatient recommendation Pump: Manual;Referral sent for [employer] (insurance is out of network and not eligible to receive a stork pump)  Consult Status Consult Status: Complete    Joshua Rojelio HERO 01/01/2024, 12:50 PM

## 2024-01-01 NOTE — Plan of Care (Signed)
 Problem: Health Behavior/Discharge Planning: Goal: Ability to manage health-related needs will improve 01/01/2024 1229 by Madison Rosina LABOR, LPN Outcome: Adequate for Discharge 01/01/2024 1229 by Madison Rosina LABOR, LPN Outcome: Progressing   Problem: Clinical Measurements: Goal: Ability to maintain clinical measurements within normal limits will improve 01/01/2024 1229 by Madison Rosina LABOR, LPN Outcome: Adequate for Discharge 01/01/2024 1229 by Madison Rosina LABOR, LPN Outcome: Progressing Goal: Will remain free from infection 01/01/2024 1229 by Madison Rosina LABOR, LPN Outcome: Adequate for Discharge 01/01/2024 1229 by Madison Rosina LABOR, LPN Outcome: Progressing Goal: Diagnostic test results will improve 01/01/2024 1229 by Madison Rosina LABOR, LPN Outcome: Adequate for Discharge 01/01/2024 1229 by Madison Rosina LABOR, LPN Outcome: Progressing Goal: Respiratory complications will improve 01/01/2024 1229 by Madison Rosina LABOR, LPN Outcome: Adequate for Discharge 01/01/2024 1229 by Madison Rosina LABOR, LPN Outcome: Progressing Goal: Cardiovascular complication will be avoided 01/01/2024 1229 by Madison Rosina LABOR, LPN Outcome: Adequate for Discharge 01/01/2024 1229 by Madison Rosina LABOR, LPN Outcome: Progressing   Problem: Coping: Goal: Level of anxiety will decrease 01/01/2024 1229 by Madison Rosina LABOR, LPN Outcome: Adequate for Discharge 01/01/2024 1229 by Madison Rosina LABOR, LPN Outcome: Progressing   Problem: Elimination: Goal: Will not experience complications related to bowel motility 01/01/2024 1229 by Madison Rosina LABOR, LPN Outcome: Adequate for Discharge 01/01/2024 1229 by Madison Rosina LABOR, LPN Outcome: Progressing Goal: Will not experience complications related to urinary retention 01/01/2024 1229 by Madison Rosina LABOR, LPN Outcome: Adequate for Discharge 01/01/2024 1229 by Madison Rosina LABOR, LPN Outcome: Progressing   Problem: Pain Managment: Goal: General experience of comfort will improve and/or be controlled 01/01/2024 1229 by Madison Rosina LABOR, LPN Outcome: Adequate for Discharge 01/01/2024 1229 by Madison Rosina LABOR, LPN Outcome: Progressing   Problem: Safety: Goal: Ability to remain free from injury will improve 01/01/2024 1229 by Madison Rosina LABOR, LPN Outcome: Adequate for Discharge 01/01/2024 1229 by Madison Rosina LABOR, LPN Outcome: Progressing   Problem: Skin Integrity: Goal: Risk for impaired skin integrity will decrease 01/01/2024 1229 by Madison Rosina LABOR, LPN Outcome: Adequate for Discharge 01/01/2024 1229 by Madison Rosina LABOR, LPN Outcome: Progressing   Problem: Education: Goal: Knowledge of disease or condition will improve 01/01/2024 1229 by Madison Rosina LABOR, LPN Outcome: Adequate for Discharge 01/01/2024 1229 by Madison Rosina LABOR, LPN Outcome: Progressing Goal: Knowledge of the prescribed therapeutic regimen will improve 01/01/2024 1229 by Madison Rosina LABOR, LPN Outcome: Adequate for Discharge 01/01/2024 1229 by Madison Rosina LABOR, LPN Outcome: Progressing   Problem: Fluid Volume: Goal: Peripheral tissue perfusion will improve 01/01/2024 1229 by Madison Rosina LABOR, LPN Outcome: Adequate for Discharge 01/01/2024 1229 by Madison Rosina LABOR, LPN Outcome: Progressing   Problem: Clinical Measurements: Goal: Complications related to disease process, condition or treatment will be avoided or minimized 01/01/2024 1229 by Madison Rosina LABOR, LPN Outcome: Adequate for Discharge 01/01/2024 1229 by Madison Rosina LABOR, LPN Outcome: Progressing   Problem: Education: Goal: Knowledge of Childbirth will improve 01/01/2024 1229 by Madison Rosina LABOR, LPN Outcome: Adequate for Discharge 01/01/2024 1229 by Madison Rosina LABOR, LPN Outcome: Progressing Goal: Ability to make informed decisions regarding treatment and plan of care will improve 01/01/2024 1229 by Madison Rosina LABOR, LPN Outcome: Adequate for Discharge 01/01/2024 1229 by Madison Rosina LABOR, LPN Outcome: Progressing Goal: Ability to state and carry out methods to decrease the pain will improve 01/01/2024 1229 by  Madison Rosina LABOR, LPN Outcome: Adequate for Discharge 01/01/2024 1229 by Madison Rosina LABOR, LPN Outcome: Progressing Goal: Individualized Educational Video(s) 01/01/2024  1229 by Madison Rosina LABOR, LPN Outcome: Adequate for Discharge 01/01/2024 1229 by Madison Rosina LABOR, LPN Outcome: Progressing   Problem: Coping: Goal: Ability to verbalize concerns and feelings about labor and delivery will improve 01/01/2024 1229 by Madison Rosina LABOR, LPN Outcome: Adequate for Discharge 01/01/2024 1229 by Madison Rosina LABOR, LPN Outcome: Progressing   Problem: Life Cycle: Goal: Ability to make normal progression through stages of labor will improve 01/01/2024 1229 by Madison Rosina LABOR, LPN Outcome: Adequate for Discharge 01/01/2024 1229 by Madison Rosina LABOR, LPN Outcome: Progressing Goal: Ability to effectively push during vaginal delivery will improve 01/01/2024 1229 by Madison Rosina LABOR, LPN Outcome: Adequate for Discharge 01/01/2024 1229 by Madison Rosina LABOR, LPN Outcome: Progressing   Problem: Role Relationship: Goal: Will demonstrate positive interactions with the child 01/01/2024 1229 by Madison Rosina LABOR, LPN Outcome: Adequate for Discharge 01/01/2024 1229 by Madison Rosina LABOR, LPN Outcome: Progressing   Problem: Safety: Goal: Risk of complications during labor and delivery will decrease 01/01/2024 1229 by Madison Rosina LABOR, LPN Outcome: Adequate for Discharge 01/01/2024 1229 by Madison Rosina LABOR, LPN Outcome: Progressing   Problem: Pain Management: Goal: Relief or control of pain from uterine contractions will improve 01/01/2024 1229 by Madison Rosina LABOR, LPN Outcome: Adequate for Discharge 01/01/2024 1229 by Madison Rosina LABOR, LPN Outcome: Progressing   Problem: Education: Goal: Knowledge of the prescribed therapeutic regimen will improve 01/01/2024 1229 by Madison Rosina LABOR, LPN Outcome: Adequate for Discharge 01/01/2024 1229 by Madison Rosina LABOR, LPN Outcome: Progressing Goal: Understanding of sexual limitations or changes related to  disease process or condition will improve 01/01/2024 1229 by Madison Rosina LABOR, LPN Outcome: Adequate for Discharge 01/01/2024 1229 by Madison Rosina LABOR, LPN Outcome: Progressing Goal: Individualized Educational Video(s) 01/01/2024 1229 by Madison Rosina LABOR, LPN Outcome: Adequate for Discharge 01/01/2024 1229 by Madison Rosina LABOR, LPN Outcome: Progressing   Problem: Self-Concept: Goal: Communication of feelings regarding changes in body function or appearance will improve 01/01/2024 1229 by Madison Rosina LABOR, LPN Outcome: Adequate for Discharge 01/01/2024 1229 by Madison Rosina LABOR, LPN Outcome: Progressing   Problem: Skin Integrity: Goal: Demonstration of wound healing without infection will improve 01/01/2024 1229 by Madison Rosina LABOR, LPN Outcome: Adequate for Discharge 01/01/2024 1229 by Madison Rosina LABOR, LPN Outcome: Progressing   Problem: Education: Goal: Knowledge of condition will improve 01/01/2024 1229 by Madison Rosina LABOR, LPN Outcome: Adequate for Discharge 01/01/2024 1229 by Madison Rosina LABOR, LPN Outcome: Progressing Goal: Individualized Educational Video(s) 01/01/2024 1229 by Madison Rosina LABOR, LPN Outcome: Adequate for Discharge 01/01/2024 1229 by Madison Rosina LABOR, LPN Outcome: Progressing Goal: Individualized Newborn Educational Video(s) 01/01/2024 1229 by Madison Rosina LABOR, LPN Outcome: Adequate for Discharge 01/01/2024 1229 by Madison Rosina LABOR, LPN Outcome: Progressing   Problem: Coping: Goal: Ability to identify and utilize available resources and services will improve 01/01/2024 1229 by Madison Rosina LABOR, LPN Outcome: Adequate for Discharge 01/01/2024 1229 by Madison Rosina LABOR, LPN Outcome: Progressing   Problem: Life Cycle: Goal: Chance of risk for complications during the postpartum period will decrease 01/01/2024 1229 by Madison Rosina LABOR, LPN Outcome: Adequate for Discharge 01/01/2024 1229 by Madison Rosina LABOR, LPN Outcome: Progressing   Problem: Role Relationship: Goal: Ability to demonstrate positive  interaction with newborn will improve 01/01/2024 1229 by Madison Rosina LABOR, LPN Outcome: Adequate for Discharge 01/01/2024 1229 by Madison Rosina LABOR, LPN Outcome: Progressing   Problem: Skin Integrity: Goal: Demonstration of wound healing without infection will improve Outcome: Adequate for Discharge   Problem: Education:  Goal: Knowledge of disease or condition will improve 01/01/2024 1229 by Madison Rosina LABOR, LPN Outcome: Adequate for Discharge 01/01/2024 1229 by Madison Rosina LABOR, LPN Outcome: Progressing Goal: Knowledge of the prescribed therapeutic regimen will improve 01/01/2024 1229 by Madison Rosina LABOR, LPN Outcome: Adequate for Discharge 01/01/2024 1229 by Madison Rosina LABOR, LPN Outcome: Progressing   Problem: Fluid Volume: Goal: Peripheral tissue perfusion will improve 01/01/2024 1229 by Madison Rosina LABOR, LPN Outcome: Adequate for Discharge 01/01/2024 1229 by Madison Rosina LABOR, LPN Outcome: Progressing   Problem: Clinical Measurements: Goal: Complications related to disease process, condition or treatment will be avoided or minimized 01/01/2024 1229 by Madison Rosina LABOR, LPN Outcome: Adequate for Discharge 01/01/2024 1229 by Madison Rosina LABOR, LPN Outcome: Progressing

## 2024-01-01 NOTE — Plan of Care (Signed)
  Problem: Health Behavior/Discharge Planning: Goal: Ability to manage health-related needs will improve Outcome: Progressing   Problem: Clinical Measurements: Goal: Ability to maintain clinical measurements within normal limits will improve Outcome: Progressing Goal: Will remain free from infection Outcome: Progressing Goal: Diagnostic test results will improve Outcome: Progressing Goal: Respiratory complications will improve Outcome: Progressing Goal: Cardiovascular complication will be avoided Outcome: Progressing   Problem: Coping: Goal: Level of anxiety will decrease Outcome: Progressing   Problem: Elimination: Goal: Will not experience complications related to bowel motility Outcome: Progressing Goal: Will not experience complications related to urinary retention Outcome: Progressing   Problem: Pain Managment: Goal: General experience of comfort will improve and/or be controlled Outcome: Progressing   Problem: Safety: Goal: Ability to remain free from injury will improve Outcome: Progressing   Problem: Skin Integrity: Goal: Risk for impaired skin integrity will decrease Outcome: Progressing   Problem: Education: Goal: Knowledge of disease or condition will improve Outcome: Progressing Goal: Knowledge of the prescribed therapeutic regimen will improve Outcome: Progressing   Problem: Fluid Volume: Goal: Peripheral tissue perfusion will improve Outcome: Progressing   Problem: Clinical Measurements: Goal: Complications related to disease process, condition or treatment will be avoided or minimized Outcome: Progressing   Problem: Education: Goal: Knowledge of Childbirth will improve Outcome: Progressing Goal: Ability to make informed decisions regarding treatment and plan of care will improve Outcome: Progressing Goal: Ability to state and carry out methods to decrease the pain will improve Outcome: Progressing Goal: Individualized Educational  Video(s) Outcome: Progressing   Problem: Coping: Goal: Ability to verbalize concerns and feelings about labor and delivery will improve Outcome: Progressing   Problem: Life Cycle: Goal: Ability to make normal progression through stages of labor will improve Outcome: Progressing Goal: Ability to effectively push during vaginal delivery will improve Outcome: Progressing   Problem: Role Relationship: Goal: Will demonstrate positive interactions with the child Outcome: Progressing   Problem: Safety: Goal: Risk of complications during labor and delivery will decrease Outcome: Progressing   Problem: Pain Management: Goal: Relief or control of pain from uterine contractions will improve Outcome: Progressing   Problem: Education: Goal: Knowledge of the prescribed therapeutic regimen will improve Outcome: Progressing Goal: Understanding of sexual limitations or changes related to disease process or condition will improve Outcome: Progressing Goal: Individualized Educational Video(s) Outcome: Progressing   Problem: Self-Concept: Goal: Communication of feelings regarding changes in body function or appearance will improve Outcome: Progressing   Problem: Skin Integrity: Goal: Demonstration of wound healing without infection will improve Outcome: Progressing   Problem: Education: Goal: Knowledge of condition will improve Outcome: Progressing Goal: Individualized Educational Video(s) Outcome: Progressing Goal: Individualized Newborn Educational Video(s) Outcome: Progressing   Problem: Coping: Goal: Ability to identify and utilize available resources and services will improve Outcome: Progressing   Problem: Life Cycle: Goal: Chance of risk for complications during the postpartum period will decrease Outcome: Progressing   Problem: Role Relationship: Goal: Ability to demonstrate positive interaction with newborn will improve Outcome: Progressing   Problem:  Education: Goal: Knowledge of disease or condition will improve Outcome: Progressing Goal: Knowledge of the prescribed therapeutic regimen will improve Outcome: Progressing   Problem: Fluid Volume: Goal: Peripheral tissue perfusion will improve Outcome: Progressing   Problem: Clinical Measurements: Goal: Complications related to disease process, condition or treatment will be avoided or minimized Outcome: Progressing

## 2024-01-01 NOTE — Discharge Instructions (Signed)

## 2024-01-08 ENCOUNTER — Ambulatory Visit: Payer: MEDICAID | Admitting: Advanced Practice Midwife

## 2024-01-08 ENCOUNTER — Encounter: Payer: Self-pay | Admitting: Advanced Practice Midwife

## 2024-01-08 VITALS — BP 137/80 | HR 96 | Ht 68.0 in | Wt 245.0 lb

## 2024-01-08 DIAGNOSIS — Z09 Encounter for follow-up examination after completed treatment for conditions other than malignant neoplasm: Secondary | ICD-10-CM

## 2024-01-08 NOTE — Progress Notes (Signed)
   GYN VISIT Patient name: Krystal [name] MRN [id number]  Date of birth: Jun 15, 2000 Chief Complaint:   Postpartum Care  History of Present Illness:   Krystal [name] is a 23 y.o. G65P1001 African-American female being seen today for f/u for incision check s/p pLTCS for FTP x 10d ago; bottlefeeding; baby doing well; only taking ibuprofen /Tylenol  for pain; started Micronor  already.     Patient's last menstrual period was 04/11/2023. The current method of family planning is abstinence and oral progesterone-only contraceptive.  Last pap May 2025. Results were: NILM w/ HRHPV not done     10/24/2023    9:10 AM 07/04/2023    2:25 PM  Depression screen PHQ 2/9  Decreased Interest 0 0  Down, Depressed, Hopeless 0 0  PHQ - 2 Score 0 0  Altered sleeping 2 3  Tired, decreased energy 2 3  Change in appetite 0 2  Feeling bad or failure about yourself  0 0  Trouble concentrating 0 0  Moving slowly or fidgety/restless 0 0  Suicidal thoughts 0 0  PHQ-9 Score 4 8  Difficult doing work/chores Not difficult at all         07/04/2023    2:25 PM  GAD 7 : Generalized Anxiety Score  Nervous, Anxious, on Edge 3  Control/stop worrying 3  Worry too much - different things 3  Trouble relaxing 3  Restless 1  Easily annoyed or irritable 3  Afraid - awful might happen 3  Total GAD 7 Score 19     Review of Systems:   Pertinent items are noted in HPI Denies fever/chills, dizziness, headaches, visual disturbances, fatigue, shortness of breath, chest pain, abdominal pain, vomiting, abnormal vaginal discharge/itching/odor/irritation, problems with periods, bowel movements, urination, or intercourse unless otherwise stated above.  Pertinent History Reviewed:  Reviewed past medical,surgical, social, obstetrical and family history.  Reviewed problem list, medications and allergies. Physical Assessment:   Vitals:   01/08/24 1059  BP: 137/80  Pulse: 96  Weight: 245 lb (111.1 kg)  Height: 5' 8 (1.727  m)  Body mass index is 37.25 kg/m.       Physical Examination:   General appearance: alert, well appearing, and in no distress  Mental status: alert, oriented to person, place, and time  Skin: warm & dry   Cardiovascular: normal heart rate noted  Respiratory: normal respiratory effort, no distress  Abdomen: soft, appropriately tender; incision well-approximated without discharge or s/s infection  Pelvic: examination not indicated  Extremities: no edema    No results found for this or any previous visit (from the past 24 hours).  Assessment & Plan:  1) s/p pLTCS x 10d ago> incision healing well; pain managed by ibuprofen  and Tylenol   2) gHTN> finished course of Lasix  and K+; BP controlled on Procardia  XL 30mg ; to stop 2 days prior to PP visit  Meds: No orders of the defined types were placed in this encounter.   No orders of the defined types were placed in this encounter.   Return for As scheduled.  [name] CNM 01/08/2024 11:16 AM

## 2024-01-08 NOTE — Patient Instructions (Signed)
Use the website www.postpartum.net for helpful postpartum resources!

## 2024-01-16 ENCOUNTER — Inpatient Hospital Stay (HOSPITAL_COMMUNITY): Admit: 2024-01-16 | Payer: MEDICAID

## 2024-01-27 ENCOUNTER — Encounter: Payer: Self-pay | Admitting: Women's Health

## 2024-01-30 ENCOUNTER — Ambulatory Visit (INDEPENDENT_AMBULATORY_CARE_PROVIDER_SITE_OTHER): Payer: MEDICAID | Admitting: Advanced Practice Midwife

## 2024-01-30 NOTE — Progress Notes (Signed)
 Post Partum Visit Note   Chief Complaint:   Postpartum Care (C-Section)  History of Present Illness:   Krystal [name] is a 23 y.o. G65P1001 African American female being seen today for a postpartum visit. She is 4 weeks postpartum following a primary cesarean section, low transverse incision (failed IOL) at 36+ gestational weeks. IOL: Yes, for Gestational hypertension. Anesthesia: epidural..  Complications: intrauterine infection. Inpatient contraception: no.   Pregnancy complicated by [facility]. Stopped procardia  2-3 weeks ago.  Tobacco use: no. Substance use disorder: no. Last pap smear:     Component Value Date/Time   DIAGPAP  09/26/2023 1353    - Negative for intraepithelial lesion or malignancy (NILM)   ADEQPAP  09/26/2023 1353    Satisfactory for evaluation; transformation zone component PRESENT.     Next pap smear due: 2028 Patient's last menstrual period was 04/11/2023.  Postpartum course has been uncomplicated. Bleeding staining only. Bowel function is normal. Bladder function is normal. Urinary incontinence? No, fecal incontinence? No Patient is not sexually active. Last sexual activity: prior to birth .    Upstream - 01/30/24 1543       Pregnancy Intention Screening   Does the patient want to become pregnant in the next year? No    Would the patient like to discuss contraceptive options today? No      Contraception Wrap Up   Current Method Oral Contraceptive    End Method Oral Contraceptive    Contraception Counseling Provided No         The pregnancy intention screening data noted above was reviewed. Potential methods of contraception were discussed. The patient elected to proceed with Oral Contraceptive.  Edinburgh Postpartum Depression Screening: Negative  Edinburgh Postnatal Depression Scale - 01/30/24 1540       Edinburgh Postnatal Depression Scale:  In the Past 7 Days   I have been able to laugh and see the funny side of things. 0    I have looked forward  with enjoyment to things. 0    I have blamed myself unnecessarily when things went wrong. 1    I have been anxious or worried for no good reason. 0    I have felt scared or panicky for no good reason. 0    Things have been getting on top of me. 0    I have been so unhappy that I have had difficulty sleeping. 0    I have felt sad or miserable. 0    I have been so unhappy that I have been crying. 0    The thought of harming myself has occurred to me. 0    Edinburgh Postnatal Depression Scale Total 1         Baby's course has been uncomplicated. Baby is feeding by breast: milk supply adequate. Infant has a pediatrician/family doctor? Yes.  Childcare strategy if returning to work/school: yes.  Pt has material needs met for her and baby: Yes.   Review of Systems:   Pertinent items are noted in HPI Denies Abnormal vaginal discharge w/ itching/odor/irritation, headaches, visual changes, shortness of breath, chest pain, abdominal pain, severe nausea/vomiting, or problems with urination or bowel movements. Pertinent History Reviewed:  Reviewed past medical,surgical, obstetrical and family history.  Reviewed problem list, medications and allergies. OB History  Gravida Para Term Preterm AB Living  1 1 1  0 0 1  SAB IAB Ectopic Multiple Live Births  0 0 0 0 1    # Outcome Date GA Lbr Len/2nd Weight Sex  Type Anes PTL Lv  1 Term 12/29/23 [redacted]w[redacted]d  7 lb 2.6 oz (3.25 kg) F CS-Vac EPI  LIV   Physical Assessment:   Vitals:   01/30/24 1537  BP: 118/84  Pulse: 87  Weight: 236 lb (107 kg)  Height: 5' 8 (1.727 m)  Body mass index is 35.88 kg/m.  Objective:  Blood pressure 118/84, pulse 87, height 5' 8 (1.727 m), weight 236 lb (107 kg), last menstrual period 04/11/2023, currently breastfeeding.  General:  alert, cooperative, and no distress   Breasts:  negative  Lungs: Normal respiratory effort  Heart:  regular rate and rhythm  Abdomen: soft, non-tender, incision well healed   Vulva:  not  evaluated  Vagina: not evaluated  Cervix:  normal  Corpus: Well involuted  Adnexa:  not evaluated  Rectal Exam: no hemorrhoids          No results found for this or any previous visit (from the past 24 hours).  Assessment & Plan:  1) Postpartum exam 2) 4 wks s/p primary cesarean section, low transverse incision 3) breast feeding 4) Depression screening 5) Contraception started POPs a few weeks ago   Essential components of care per ACOG recommendations:  1.  Mood and well being:  If positive depression screen, discussed and plan developed.  If using tobacco we discussed reduction/cessation and risk of relapse If current substance abuse, we discussed and referral to local resources was offered.   2. Infant care and feeding:  If breastfeeding, discussed returning to work, pumping, breastfeeding-associated pain, guidance regarding return to fertility while lactating if not using another method. If needed, patient was provided with a letter to be allowed to pump q 2-3hrs to support lactation in a private location with access to a refrigerator to store breastmilk.   Recommended that all caregivers be immunized for flu, pertussis and other preventable communicable diseases If pt does not have material needs met for her/baby, referred to local resources for help obtaining these.  3. Sexuality, contraception and birth spacing Provided guidance regarding sexuality, management of dyspareunia, and resumption of intercourse Discussed avoiding interpregnancy interval <58mths and recommended birth spacing of 18 months  4. Sleep and fatigue Discussed coping options for fatigue and sleep disruption Encouraged family/partner/community support of 4 hrs of uninterrupted sleep to help with mood and fatigue  5. Physical recovery  If pt had a C/S, assessed incisional pain and providing guidance on normal vs prolonged recovery If pt had a laceration, perineal healing and pain reviewed.  If urinary or  fecal incontinence, discussed management and referred to PT or uro/gyn if indicated  Patient is safe to resume physical activity. Discussed attainment of healthy weight.  6.  Chronic disease management Discussed pregnancy complications if any, and their implications for future childbearing and long-term maternal health. Review recommendations for prevention of recurrent pregnancy complications, such as aspirin  to reduce risk of preeclampsia yes. Pt had GDM: No. If yes, 2hr GTT scheduled: not applicable. Reviewed medications and non-pregnant dosing including consideration of whether pt is breastfeeding using a reliable resource such as LactMed: not applicable Referred for f/u w/ PCP or subspecialist providers as indicated: not applicable  7. Health maintenance Mammogram at 21yo or earlier if indicated Pap smears as indicated  Meds: No orders of the defined types were placed in this encounter.   Follow-up: No follow-ups on file.   No orders of the defined types were placed in this encounter.      Cathlean Ely DNP, CNM [facility]  [facility], [facility] 01/30/2024 3:53 PM

## 2024-01-31 ENCOUNTER — Encounter: Payer: Self-pay | Admitting: *Deleted

## 2024-02-10 ENCOUNTER — Ambulatory Visit: Payer: MEDICAID | Admitting: Obstetrics and Gynecology

## 2024-03-04 ENCOUNTER — Encounter: Payer: Self-pay | Admitting: Advanced Practice Midwife

## 2024-03-04 ENCOUNTER — Other Ambulatory Visit: Payer: Self-pay | Admitting: Women's Health

## 2024-03-04 MED ORDER — SLYND 4 MG PO TABS: 1.0000 | ORAL_TABLET | Freq: Every day | ORAL | 3 refills | Status: AC

## 2024-03-26 ENCOUNTER — Ambulatory Visit: Payer: Self-pay

## 2024-03-26 ENCOUNTER — Ambulatory Visit
Admission: RE | Admit: 2024-03-26 | Discharge: 2024-03-26 | Disposition: A | Discharge status: Home / Self Care | Payer: MEDICAID | Attending: Family Medicine | Admitting: Family Medicine

## 2024-03-26 VITALS — BP 106/80 | HR 84 | Temp 98.6°F | Resp 18

## 2024-03-26 DIAGNOSIS — K130 Diseases of lips: Secondary | ICD-10-CM | POA: Diagnosis not present

## 2024-03-26 MED ORDER — VALACYCLOVIR HCL 1 G PO TABS: 1000.0000 mg | ORAL_TABLET | Freq: Two times a day (BID) | ORAL | 0 refills | Status: AC

## 2024-03-26 NOTE — ED Provider Notes (Signed)
 [facility]    CSN: [id number] Arrival date & time: 03/26/24  1136      History   Chief Complaint Chief Complaint  Patient presents with   Blister    Swollen lip, fever blister - Entered by patient    HPI Krystal [name] is a 23 y.o. female.   Patient presenting today with 3-day history of swollen blistered area to the left upper lip.  States she tends to get these with her menstrual cycles fairly regularly.  Has always had negative blood work for HSV when it has been checked in the past, states she has never had the sore swab directly.  Usually goes away with [employer] so she typically does not come in to be seen when these occur.  Denies fever, chills, new products used, new foods tried.    Past Medical History:  Diagnosis Date   Adenotonsillar hypertrophy 11/19/2011   snores during sleep, stops breathing, and wakes up coughing/choking, per mother   Depression with anxiety    Pregnancy induced hypertension    Seasonal allergies     Patient Active Problem List   Diagnosis Date Noted   S/P primary low transverse C-section 12/29/2023   Intrauterine infection 12/29/2023   Gestational hypertension 12/19/2023   Heartburn during pregnancy in third trimester 11/26/2023   Alpha thalassemia silent carrier 07/16/2023   Depression with anxiety 07/04/2023   Supervision of high risk pregnancy, antepartum 06/21/2023   MDD (major depressive disorder) 04/21/2021   Chlamydia 10/30/2017    Past Surgical History:  Procedure Laterality Date   CESAREAN SECTION N/A 12/29/2023   Procedure: CESAREAN DELIVERY;  Surgeon: Eveline Lynwood MATSU, MD;  Location: [facility];  Service: Obstetrics;  Laterality: N/A;   CLOSED REDUCTION FOREARM FRACTURE  09/28/2009   left distal radius/ulna   TONSILLECTOMY     TONSILLECTOMY AND ADENOIDECTOMY  11/27/2011   Procedure: TONSILLECTOMY AND ADENOIDECTOMY;  Surgeon: Ana LELON Moccasin, MD;  Location: [facility];  Service: ENT;  Laterality:  N/A;   UMBILICAL HERNIA REPAIR  03/14/2007   UPPER GASTROINTESTINAL ENDOSCOPY  12/02/2003   after toxic ingestion    OB History     Gravida  1   Para  1   Term  1   Preterm  0   AB  0   Living  1      SAB  0   IAB  0   Ectopic  0   Multiple  0   Live Births  1            Home Medications    Prior to Admission medications   Medication Sig Start Date End Date Taking? Authorizing Provider  Drospirenone (SLYND) 4 MG TABS Take 1 tablet (4 mg total) by mouth daily. 03/04/24   Kizzie Suzen SAUNDERS, CNM  valACYclovir (VALTREX) 1000 MG tablet Take 1 tablet (1,000 mg total) by mouth 2 (two) times daily for 10 days. 03/26/24 04/05/24 Yes Stuart Vernell Norris, PA-C  acetaminophen  (TYLENOL ) 325 MG tablet Take 2 tablets (650 mg total) by mouth every 6 (six) hours as needed. Patient not taking: Reported on 01/30/2024 01/01/24   Ozan, Jennifer, DO  ferrous gluconate  (FERGON) 324 MG tablet Take 1 tablet (324 mg total) by mouth every other day. Patient not taking: Reported on 01/30/2024 01/01/24   Ozan, Jennifer, DO  ibuprofen  (ADVIL ) 600 MG tablet Take 1 tablet (600 mg total) by mouth every 6 (six) hours. Patient not taking: Reported on 01/30/2024 01/01/24  Ozan, Jennifer, DO  NIFEdipine  (ADALAT  CC) 30 MG 24 hr tablet Take 1 tablet (30 mg total) by mouth daily. Patient not taking: Reported on 01/30/2024 01/01/24 01/31/24  Ozan, Jennifer, DO    Family History Family History  Problem Relation Age of Onset   Healthy Mother    Sickle cell trait Father    Hypertension Maternal Grandmother    Diabetes Maternal Grandmother    Diabetes Maternal Grandfather     Social History Social History   Tobacco Use   Smoking status: Every Day    Types: E-cigarettes   Smokeless tobacco: Never   Tobacco comments:    inside smokers at home  Vaping Use   Vaping status: Every Day   Substances: Nicotine, Flavoring  Substance Use Topics   Alcohol use: Not Currently   Drug use: Yes    Types:  Marijuana    Comment: occassional     Allergies   Patient has no known allergies.   Review of Systems Review of Systems PER HPI  Physical Exam Triage Vital Signs ED Triage Vitals  Encounter Vitals Group     BP 03/26/24 1200 106/80     Girls Systolic BP Percentile --      Girls Diastolic BP Percentile --      Boys Systolic BP Percentile --      Boys Diastolic BP Percentile --      Pulse Rate 03/26/24 1200 84     Resp 03/26/24 1200 18     Temp 03/26/24 1200 98.6 F (37 C)     Temp Source 03/26/24 1200 Oral     SpO2 03/26/24 1200 97 %     Weight --      Height --      Head Circumference --      Peak Flow --      Pain Score 03/26/24 1158 0     Pain Loc --      Pain Education --      Exclude from Growth Chart --    No data found.  Updated Vital Signs BP 106/80 (BP Location: Right Arm)   Pulse 84   Temp 98.6 F (37 C) (Oral)   Resp 18   LMP 03/22/2024 (Exact Date)   SpO2 97%   Breastfeeding No   Visual Acuity Right Eye Distance:   Left Eye Distance:   Bilateral Distance:    Right Eye Near:   Left Eye Near:    Bilateral Near:     Physical Exam Vitals and nursing note reviewed.  Constitutional:      Appearance: Normal appearance. She is not ill-appearing.  HENT:     Head: Atraumatic.     Mouth/Throat:     Mouth: Mucous membranes are moist.     Comments: Left upper lip with localized edema, blistering and crusting Eyes:     Extraocular Movements: Extraocular movements intact.     Conjunctiva/sclera: Conjunctivae normal.  Cardiovascular:     Rate and Rhythm: Normal rate.  Pulmonary:     Effort: Pulmonary effort is normal.  Musculoskeletal:        General: Normal range of motion.     Cervical back: Normal range of motion and neck supple.  Skin:    General: Skin is warm and dry.  Neurological:     Mental Status: She is alert and oriented to person, place, and time.  Psychiatric:        Mood and Affect: Mood normal.  Thought Content: Thought  content normal.        Judgment: Judgment normal.      UC Treatments / Results  Labs (all labs ordered are listed, but only abnormal results are displayed) Labs Reviewed  HSV 1/2 PCR (SURFACE)    EKG   Radiology No results found.  Procedures Procedures (including critical care time)  Medications Ordered in UC Medications - No data to display  Initial Impression / Assessment and Plan / UC Course  I have reviewed the triage vital signs and the nursing notes.  Pertinent labs & imaging results that were available during my care of the patient were reviewed by me and considered in my medical decision making (see chart for details).     Suspect cold sore, area swabbed and HSV culture sent.  Trial Valtrex, ice, over-the-counter remedies as needed.  Work note given.  Return for worsening or unresolving symptoms.  Final Clinical Impressions(s) / UC Diagnoses   Final diagnoses:  Lip lesion   Discharge Instructions   None    ED Prescriptions     Medication Sig Dispense Auth. Provider   valACYclovir (VALTREX) 1000 MG tablet Take 1 tablet (1,000 mg total) by mouth 2 (two) times daily for 10 days. 20 tablet Stuart Vernell Norris, [place]      PDMP not reviewed this encounter.   Stuart Vernell Norris, [place] 03/26/24 1228

## 2024-03-26 NOTE — ED Triage Notes (Signed)
 Pt being seen in UC for swollen lip and blister that started approximately 3 days ago. Pt reports getting them every other menstrual cycle, but hs not had any relief with otc medication. Pt denies sore throat, swelling of throat, and difficulty swallowing.

## 2024-03-27 ENCOUNTER — Ambulatory Visit: Payer: Self-pay

## 2024-03-27 LAB — HSV 1/2 PCR (SURFACE)
HSV-1 DNA: DETECTED — AB
HSV-2 DNA: NOT DETECTED

## 2024-06-25 ENCOUNTER — Encounter: Payer: Self-pay | Admitting: Adult Health
